# Patient Record
Sex: Male | Born: 1995 | Race: White | Hispanic: No | Marital: Single | Smoking: Never smoker
Health system: Southern US, Community
[De-identification: ages and names within clinical notes are randomized; demographics above are authoritative.]

## PROBLEM LIST (undated history)

## (undated) HISTORY — PX: APPENDECTOMY: SHX54

---
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
## 2019-02-18 ENCOUNTER — Emergency Department (HOSPITAL_COMMUNITY): Payer: No Typology Code available for payment source

## 2019-02-18 ENCOUNTER — Encounter (HOSPITAL_COMMUNITY): Admission: EM | Disposition: A | Discharge status: Home / Self Care | Payer: Self-pay | Source: Home / Self Care

## 2019-02-18 ENCOUNTER — Inpatient Hospital Stay (HOSPITAL_COMMUNITY): Payer: No Typology Code available for payment source | Admitting: Certified Registered"

## 2019-02-18 ENCOUNTER — Inpatient Hospital Stay (HOSPITAL_COMMUNITY): Payer: No Typology Code available for payment source

## 2019-02-18 ENCOUNTER — Encounter (HOSPITAL_COMMUNITY): Payer: Self-pay | Admitting: *Deleted

## 2019-02-18 ENCOUNTER — Inpatient Hospital Stay (HOSPITAL_COMMUNITY)
Admission: EM | Admit: 2019-02-18 | Discharge: 2019-02-22 | DRG: 116 | Disposition: A | Discharge status: Home / Self Care | Payer: No Typology Code available for payment source | Attending: General Surgery | Admitting: General Surgery

## 2019-02-18 ENCOUNTER — Other Ambulatory Visit: Payer: Self-pay

## 2019-02-18 DIAGNOSIS — M25572 Pain in left ankle and joints of left foot: Secondary | ICD-10-CM | POA: Diagnosis present

## 2019-02-18 DIAGNOSIS — S0522XA Ocular laceration and rupture with prolapse or loss of intraocular tissue, left eye, initial encounter: Principal | ICD-10-CM | POA: Diagnosis present

## 2019-02-18 DIAGNOSIS — S8252XA Displaced fracture of medial malleolus of left tibia, initial encounter for closed fracture: Secondary | ICD-10-CM | POA: Diagnosis present

## 2019-02-18 DIAGNOSIS — M542 Cervicalgia: Secondary | ICD-10-CM

## 2019-02-18 DIAGNOSIS — S022XXA Fracture of nasal bones, initial encounter for closed fracture: Secondary | ICD-10-CM

## 2019-02-18 DIAGNOSIS — J939 Pneumothorax, unspecified: Secondary | ICD-10-CM

## 2019-02-18 DIAGNOSIS — S01111A Laceration without foreign body of right eyelid and periocular area, initial encounter: Secondary | ICD-10-CM | POA: Diagnosis present

## 2019-02-18 DIAGNOSIS — S270XXA Traumatic pneumothorax, initial encounter: Secondary | ICD-10-CM | POA: Diagnosis present

## 2019-02-18 DIAGNOSIS — S0532XA Ocular laceration without prolapse or loss of intraocular tissue, left eye, initial encounter: Secondary | ICD-10-CM

## 2019-02-18 DIAGNOSIS — S62305A Unspecified fracture of fourth metacarpal bone, left hand, initial encounter for closed fracture: Secondary | ICD-10-CM | POA: Diagnosis present

## 2019-02-18 DIAGNOSIS — Y92410 Unspecified street and highway as the place of occurrence of the external cause: Secondary | ICD-10-CM

## 2019-02-18 DIAGNOSIS — S27322A Contusion of lung, bilateral, initial encounter: Secondary | ICD-10-CM | POA: Diagnosis present

## 2019-02-18 DIAGNOSIS — M25571 Pain in right ankle and joints of right foot: Secondary | ICD-10-CM

## 2019-02-18 DIAGNOSIS — S31821A Laceration without foreign body of left buttock, initial encounter: Secondary | ICD-10-CM

## 2019-02-18 DIAGNOSIS — S52512A Displaced fracture of left radial styloid process, initial encounter for closed fracture: Secondary | ICD-10-CM | POA: Diagnosis present

## 2019-02-18 DIAGNOSIS — Z20828 Contact with and (suspected) exposure to other viral communicable diseases: Secondary | ICD-10-CM | POA: Diagnosis present

## 2019-02-18 DIAGNOSIS — R402412 Glasgow coma scale score 13-15, at arrival to emergency department: Secondary | ICD-10-CM | POA: Diagnosis present

## 2019-02-18 DIAGNOSIS — H2102 Hyphema, left eye: Secondary | ICD-10-CM

## 2019-02-18 DIAGNOSIS — S025XXA Fracture of tooth (traumatic), initial encounter for closed fracture: Secondary | ICD-10-CM | POA: Diagnosis present

## 2019-02-18 DIAGNOSIS — Z23 Encounter for immunization: Secondary | ICD-10-CM | POA: Diagnosis not present

## 2019-02-18 DIAGNOSIS — S2231XA Fracture of one rib, right side, initial encounter for closed fracture: Secondary | ICD-10-CM | POA: Diagnosis present

## 2019-02-18 HISTORY — PX: RUPTURED GLOBE EXPLORATION AND REPAIR: SHX2366

## 2019-02-18 LAB — COMPREHENSIVE METABOLIC PANEL
ALT: 52 U/L — ABNORMAL HIGH (ref 0–44)
AST: 67 U/L — ABNORMAL HIGH (ref 15–41)
Albumin: 4.2 g/dL (ref 3.5–5.0)
Alkaline Phosphatase: 48 U/L (ref 38–126)
Anion gap: 15 (ref 5–15)
BUN: 14 mg/dL (ref 6–20)
CO2: 19 mmol/L — ABNORMAL LOW (ref 22–32)
Calcium: 9.1 mg/dL (ref 8.9–10.3)
Chloride: 106 mmol/L (ref 98–111)
Creatinine, Ser: 1.11 mg/dL (ref 0.61–1.24)
GFR calc Af Amer: >60 mL/min (ref >60)
GFR calc non Af Amer: >60 mL/min (ref >60)
Glucose, Bld: 109 mg/dL — ABNORMAL HIGH (ref 70–99)
Potassium: 3.4 mmol/L — ABNORMAL LOW (ref 3.5–5.1)
Sodium: 140 mmol/L (ref 135–145)
Total Bilirubin: 0.5 mg/dL (ref 0.3–1.2)
Total Protein: 7.2 g/dL (ref 6.5–8.1)

## 2019-02-18 LAB — PROTIME-INR
INR: 1.1 (ref 0.8–1.2)
Prothrombin Time: 14.3 seconds (ref 11.4–15.2)

## 2019-02-18 LAB — SURGICAL PCR SCREEN
MRSA, PCR: NEGATIVE
Staphylococcus aureus: POSITIVE — AB

## 2019-02-18 LAB — ETHANOL: Alcohol, Ethyl (B): 65 mg/dL — ABNORMAL HIGH (ref <10)

## 2019-02-18 LAB — CBC
HCT: 45 % (ref 39.0–52.0)
Hemoglobin: 15.5 g/dL (ref 13.0–17.0)
MCH: 31.3 pg (ref 26.0–34.0)
MCHC: 34.4 g/dL (ref 30.0–36.0)
MCV: 90.7 fL (ref 80.0–100.0)
Platelets: 254 10*3/uL (ref 150–400)
RBC: 4.96 MIL/uL (ref 4.22–5.81)
RDW: 11.8 % (ref 11.5–15.5)
WBC: 20.5 10*3/uL — ABNORMAL HIGH (ref 4.0–10.5)
nRBC: 0 % (ref 0.0–0.2)

## 2019-02-18 LAB — CDS SEROLOGY

## 2019-02-18 LAB — SARS CORONAVIRUS 2 BY RT PCR (HOSPITAL ORDER, PERFORMED IN ~~LOC~~ HOSPITAL LAB): SARS Coronavirus 2: NEGATIVE

## 2019-02-18 LAB — HIV ANTIBODY (ROUTINE TESTING W REFLEX): HIV Screen 4th Generation wRfx: NONREACTIVE

## 2019-02-18 LAB — SAMPLE TO BLOOD BANK

## 2019-02-18 LAB — LACTIC ACID, PLASMA: Lactic Acid, Venous: 3.1 mmol/L (ref 0.5–1.9)

## 2019-02-18 SURGERY — REPAIR, RUPTURE, GLOBE
Anesthesia: General | Site: Eye | Laterality: Left

## 2019-02-18 MED ORDER — MEPERIDINE HCL 25 MG/ML IJ SOLN: 6.2500 mg | INTRAMUSCULAR | Status: DC | PRN

## 2019-02-18 MED ORDER — FLUORESCEIN SODIUM 1 MG OP STRP
ORAL_STRIP | OPHTHALMIC | Status: AC
  Filled 2019-02-18: qty 1

## 2019-02-18 MED ORDER — TRIAMCINOLONE ACETONIDE 40 MG/ML IJ SUSP
INTRAMUSCULAR | Status: DC | PRN
  Administered 2019-02-18: 40 mg

## 2019-02-18 MED ORDER — LORAZEPAM 1 MG PO TABS: 1.0000 mg | ORAL_TABLET | ORAL | Status: AC | PRN

## 2019-02-18 MED ORDER — LACTATED RINGERS IV SOLN: INTRAVENOUS | Status: DC

## 2019-02-18 MED ORDER — ROCURONIUM BROMIDE 10 MG/ML (PF) SYRINGE
PREFILLED_SYRINGE | INTRAVENOUS | Status: AC
  Filled 2019-02-18: qty 20

## 2019-02-18 MED ORDER — DEXAMETHASONE SODIUM PHOSPHATE 10 MG/ML IJ SOLN
INTRAMUSCULAR | Status: AC
  Filled 2019-02-18: qty 1

## 2019-02-18 MED ORDER — SODIUM CHLORIDE 0.9 % IV SOLN
INTRAVENOUS | Status: DC
  Administered 2019-02-18 – 2019-02-21 (×6): via INTRAVENOUS

## 2019-02-18 MED ORDER — METOPROLOL TARTRATE 5 MG/5ML IV SOLN: 5.0000 mg | Freq: Four times a day (QID) | INTRAVENOUS | Status: DC | PRN

## 2019-02-18 MED ORDER — ADULT MULTIVITAMIN W/MINERALS CH
1.0000 | ORAL_TABLET | Freq: Every day | ORAL | Status: DC
  Administered 2019-02-19 – 2019-02-22 (×4): 1 via ORAL
  Filled 2019-02-18 (×4): qty 1

## 2019-02-18 MED ORDER — HYDROMORPHONE HCL 1 MG/ML IJ SOLN: 0.2500 mg | INTRAMUSCULAR | Status: DC | PRN

## 2019-02-18 MED ORDER — MIDAZOLAM HCL 2 MG/2ML IJ SOLN
INTRAMUSCULAR | Status: AC
  Filled 2019-02-18: qty 2

## 2019-02-18 MED ORDER — TRIAMCINOLONE ACETONIDE 40 MG/ML IJ SUSP
INTRAMUSCULAR | Status: AC
  Filled 2019-02-18: qty 5

## 2019-02-18 MED ORDER — BACITRACIN-POLYMYXIN B 500-10000 UNIT/GM OP OINT
TOPICAL_OINTMENT | OPHTHALMIC | Status: AC
  Filled 2019-02-18: qty 3.5

## 2019-02-18 MED ORDER — PANTOPRAZOLE SODIUM 40 MG PO TBEC
40.0000 mg | DELAYED_RELEASE_TABLET | Freq: Every day | ORAL | Status: DC
  Administered 2019-02-19 – 2019-02-22 (×4): 40 mg via ORAL
  Filled 2019-02-18 (×4): qty 1

## 2019-02-18 MED ORDER — PROPOFOL 10 MG/ML IV BOLUS
INTRAVENOUS | Status: DC | PRN
  Administered 2019-02-18: 50 mg via INTRAVENOUS
  Administered 2019-02-18: 160 mg via INTRAVENOUS

## 2019-02-18 MED ORDER — BUPIVACAINE HCL (PF) 0.75 % IJ SOLN
INTRAMUSCULAR | Status: AC
  Filled 2019-02-18: qty 10

## 2019-02-18 MED ORDER — LIDOCAINE-EPINEPHRINE (PF) 2 %-1:200000 IJ SOLN
20.0000 mL | Freq: Once | INTRAMUSCULAR | Status: AC
  Administered 2019-02-18: 20 mL
  Filled 2019-02-18: qty 20

## 2019-02-18 MED ORDER — SUGAMMADEX SODIUM 200 MG/2ML IV SOLN
INTRAVENOUS | Status: DC | PRN
  Administered 2019-02-18: 150 mg via INTRAVENOUS

## 2019-02-18 MED ORDER — FENTANYL CITRATE (PF) 250 MCG/5ML IJ SOLN
INTRAMUSCULAR | Status: AC
  Filled 2019-02-18: qty 5

## 2019-02-18 MED ORDER — SODIUM HYALURONATE 10 MG/ML IO SOLN
INTRAOCULAR | Status: AC
  Filled 2019-02-18: qty 1.7

## 2019-02-18 MED ORDER — ACETAMINOPHEN 325 MG PO TABS: 650.0000 mg | ORAL_TABLET | ORAL | Status: DC | PRN

## 2019-02-18 MED ORDER — ONDANSETRON HCL 4 MG/2ML IJ SOLN
INTRAMUSCULAR | Status: AC
  Filled 2019-02-18: qty 2

## 2019-02-18 MED ORDER — STERILE WATER FOR INJECTION IJ SOLN
INTRAMUSCULAR | Status: DC | PRN
  Administered 2019-02-18: 19:00:00 20 mL

## 2019-02-18 MED ORDER — ATROPINE SULFATE 1 % OP SOLN
OPHTHALMIC | Status: DC | PRN
  Administered 2019-02-18: 2 [drp] via OPHTHALMIC

## 2019-02-18 MED ORDER — PROPOFOL 10 MG/ML IV BOLUS
INTRAVENOUS | Status: AC
  Filled 2019-02-18: qty 20

## 2019-02-18 MED ORDER — MORPHINE SULFATE (PF) 2 MG/ML IV SOLN
1.0000 mg | INTRAVENOUS | Status: DC | PRN
  Administered 2019-02-18 – 2019-02-21 (×6): 2 mg via INTRAVENOUS
  Filled 2019-02-18 (×6): qty 1

## 2019-02-18 MED ORDER — FENTANYL CITRATE (PF) 100 MCG/2ML IJ SOLN
50.0000 ug | Freq: Once | INTRAMUSCULAR | Status: AC
  Administered 2019-02-18: 06:00:00 50 ug via INTRAVENOUS
  Filled 2019-02-18: qty 2

## 2019-02-18 MED ORDER — VITAMIN B-1 100 MG PO TABS
100.0000 mg | ORAL_TABLET | Freq: Every day | ORAL | Status: DC
  Administered 2019-02-19 – 2019-02-22 (×4): 100 mg via ORAL
  Filled 2019-02-18 (×4): qty 1

## 2019-02-18 MED ORDER — DEXMEDETOMIDINE HCL 200 MCG/2ML IV SOLN
INTRAVENOUS | Status: DC | PRN
  Administered 2019-02-18: 20 ug via INTRAVENOUS

## 2019-02-18 MED ORDER — FOLIC ACID 1 MG PO TABS
1.0000 mg | ORAL_TABLET | Freq: Every day | ORAL | Status: DC
  Administered 2019-02-19 – 2019-02-22 (×4): 1 mg via ORAL
  Filled 2019-02-18 (×4): qty 1

## 2019-02-18 MED ORDER — CEFAZOLIN SODIUM-DEXTROSE 2-4 GM/100ML-% IV SOLN
2.0000 g | Freq: Once | INTRAVENOUS | Status: AC
  Administered 2019-02-18: 06:00:00 2 g via INTRAVENOUS
  Filled 2019-02-18: qty 100

## 2019-02-18 MED ORDER — LORAZEPAM 2 MG/ML IJ SOLN: 1.0000 mg | INTRAMUSCULAR | Status: AC | PRN

## 2019-02-18 MED ORDER — CEFTAZIDIME 1 G IJ SOLR
INTRAMUSCULAR | Status: AC
  Filled 2019-02-18: qty 1

## 2019-02-18 MED ORDER — PANTOPRAZOLE SODIUM 40 MG IV SOLR
40.0000 mg | Freq: Every day | INTRAVENOUS | Status: DC
  Administered 2019-02-18: 40 mg via INTRAVENOUS
  Filled 2019-02-18 (×3): qty 40

## 2019-02-18 MED ORDER — BSS IO SOLN
INTRAOCULAR | Status: DC | PRN
  Administered 2019-02-18: 15 mL

## 2019-02-18 MED ORDER — NA CHONDROIT SULF-NA HYALURON 40-30 MG/ML IO SOLN
INTRAOCULAR | Status: AC
  Filled 2019-02-18: qty 0.5

## 2019-02-18 MED ORDER — NA CHONDROIT SULF-NA HYALURON 40-30 MG/ML IO SOLN
INTRAOCULAR | Status: DC | PRN
  Administered 2019-02-18: 0.5 mL via INTRAOCULAR

## 2019-02-18 MED ORDER — DEXAMETHASONE SODIUM PHOSPHATE 10 MG/ML IJ SOLN
INTRAMUSCULAR | Status: DC | PRN
  Administered 2019-02-18: 4 mg via INTRAVENOUS

## 2019-02-18 MED ORDER — LIDOCAINE HCL 2 % IJ SOLN
INTRAMUSCULAR | Status: AC
  Filled 2019-02-18: qty 20

## 2019-02-18 MED ORDER — FLUORESCEIN SODIUM 1 MG OP STRP
ORAL_STRIP | OPHTHALMIC | Status: DC | PRN
  Administered 2019-02-18: 1 via OPHTHALMIC

## 2019-02-18 MED ORDER — IOHEXOL 300 MG/ML  SOLN
100.0000 mL | Freq: Once | INTRAMUSCULAR | Status: AC | PRN
  Administered 2019-02-18: 100 mL via INTRAVENOUS

## 2019-02-18 MED ORDER — ONDANSETRON HCL 4 MG/2ML IJ SOLN: 4.0000 mg | Freq: Four times a day (QID) | INTRAMUSCULAR | Status: DC | PRN

## 2019-02-18 MED ORDER — SODIUM HYALURONATE 10 MG/ML IO SOLN
INTRAOCULAR | Status: AC
  Filled 2019-02-18: qty 0.85

## 2019-02-18 MED ORDER — LIDOCAINE HCL 2 % IJ SOLN
INTRAMUSCULAR | Status: DC | PRN
  Administered 2019-02-18: 10 mL

## 2019-02-18 MED ORDER — TETRACAINE 0.5 % OP SOLN OPTIME - NO CHARGE
OPHTHALMIC | Status: DC | PRN
  Administered 2019-02-18: 19:00:00 3 [drp] via OPHTHALMIC

## 2019-02-18 MED ORDER — ACETAMINOPHEN 325 MG PO TABS: 325.0000 mg | ORAL_TABLET | Freq: Once | ORAL | Status: DC | PRN

## 2019-02-18 MED ORDER — THIAMINE HCL 100 MG/ML IJ SOLN: 100.0000 mg | Freq: Every day | INTRAMUSCULAR | Status: DC

## 2019-02-18 MED ORDER — LIDOCAINE 2% (20 MG/ML) 5 ML SYRINGE
INTRAMUSCULAR | Status: DC | PRN
  Administered 2019-02-18: 60 mg via INTRAVENOUS

## 2019-02-18 MED ORDER — ACETAMINOPHEN 160 MG/5ML PO SOLN: 325.0000 mg | Freq: Once | ORAL | Status: DC | PRN

## 2019-02-18 MED ORDER — OXYCODONE HCL 5 MG PO TABS
5.0000 mg | ORAL_TABLET | ORAL | Status: DC | PRN
  Administered 2019-02-19 – 2019-02-22 (×9): 10 mg via ORAL
  Filled 2019-02-18 (×10): qty 2

## 2019-02-18 MED ORDER — TETRACAINE HCL 0.5 % OP SOLN
2.0000 [drp] | Freq: Once | OPHTHALMIC | Status: AC
  Administered 2019-02-18: 2 [drp] via OPHTHALMIC
  Filled 2019-02-18: qty 4

## 2019-02-18 MED ORDER — ACETAMINOPHEN 10 MG/ML IV SOLN: 1000.0000 mg | Freq: Once | INTRAVENOUS | Status: DC | PRN

## 2019-02-18 MED ORDER — ATROPINE SULFATE 1 % OP SOLN
OPHTHALMIC | Status: AC
  Filled 2019-02-18: qty 5

## 2019-02-18 MED ORDER — ONDANSETRON 4 MG PO TBDP: 4.0000 mg | ORAL_TABLET | Freq: Four times a day (QID) | ORAL | Status: DC | PRN

## 2019-02-18 MED ORDER — ROCURONIUM BROMIDE 10 MG/ML (PF) SYRINGE
PREFILLED_SYRINGE | INTRAVENOUS | Status: DC | PRN
  Administered 2019-02-18: 50 mg via INTRAVENOUS

## 2019-02-18 MED ORDER — POLYMYXIN B SULFATE 500000 UNITS IJ SOLR
INTRAMUSCULAR | Status: AC
  Filled 2019-02-18: qty 500000

## 2019-02-18 MED ORDER — BACITRACIN-POLYMYXIN B 500-10000 UNIT/GM OP OINT
TOPICAL_OINTMENT | OPHTHALMIC | Status: DC | PRN
  Administered 2019-02-18: 1 via OPHTHALMIC

## 2019-02-18 MED ORDER — BUPIVACAINE HCL (PF) 0.75 % IJ SOLN
INTRAMUSCULAR | Status: DC | PRN
  Administered 2019-02-18: 10 mL

## 2019-02-18 MED ORDER — STERILE WATER FOR INJECTION IJ SOLN
INTRAMUSCULAR | Status: AC
  Filled 2019-02-18: qty 10

## 2019-02-18 MED ORDER — TETANUS-DIPHTH-ACELL PERTUSSIS 5-2.5-18.5 LF-MCG/0.5 IM SUSP
0.5000 mL | Freq: Once | INTRAMUSCULAR | Status: AC
  Administered 2019-02-18: 0.5 mL via INTRAMUSCULAR
  Filled 2019-02-18: qty 0.5

## 2019-02-18 MED ORDER — PROMETHAZINE HCL 25 MG/ML IJ SOLN: 6.2500 mg | INTRAMUSCULAR | Status: DC | PRN

## 2019-02-18 MED ORDER — PHENYLEPHRINE 40 MCG/ML (10ML) SYRINGE FOR IV PUSH (FOR BLOOD PRESSURE SUPPORT)
PREFILLED_SYRINGE | INTRAVENOUS | Status: DC | PRN
  Administered 2019-02-18 (×2): 80 ug via INTRAVENOUS

## 2019-02-18 MED ORDER — SUCCINYLCHOLINE CHLORIDE 20 MG/ML IJ SOLN
INTRAMUSCULAR | Status: DC | PRN
  Administered 2019-02-18: 160 mg via INTRAVENOUS

## 2019-02-18 MED ORDER — SODIUM CHLORIDE (PF) 0.9 % IJ SOLN
INTRAMUSCULAR | Status: AC
  Filled 2019-02-18: qty 10

## 2019-02-18 MED ORDER — FLUORESCEIN SODIUM 1 MG OP STRP
1.0000 | ORAL_STRIP | Freq: Once | OPHTHALMIC | Status: AC
  Administered 2019-02-18: 1 via OPHTHALMIC
  Filled 2019-02-18: qty 1

## 2019-02-18 MED ORDER — SODIUM HYALURONATE 10 MG/ML IO SOLN
INTRAOCULAR | Status: DC | PRN
  Administered 2019-02-18 (×2): 0.85 mL via INTRAOCULAR

## 2019-02-18 MED ORDER — BSS IO SOLN
INTRAOCULAR | Status: AC
  Filled 2019-02-18: qty 15

## 2019-02-18 MED ORDER — LIDOCAINE 2% (20 MG/ML) 5 ML SYRINGE
INTRAMUSCULAR | Status: AC
  Filled 2019-02-18: qty 5

## 2019-02-18 MED ORDER — DOCUSATE SODIUM 100 MG PO CAPS
100.0000 mg | ORAL_CAPSULE | Freq: Two times a day (BID) | ORAL | Status: DC
  Administered 2019-02-18 – 2019-02-22 (×8): 100 mg via ORAL
  Filled 2019-02-18 (×8): qty 1

## 2019-02-18 MED ORDER — ESMOLOL HCL 100 MG/10ML IV SOLN
INTRAVENOUS | Status: AC
  Filled 2019-02-18: qty 10

## 2019-02-18 MED ORDER — ONDANSETRON HCL 4 MG/2ML IJ SOLN
4.0000 mg | Freq: Once | INTRAMUSCULAR | Status: AC
  Administered 2019-02-18: 4 mg via INTRAVENOUS
  Filled 2019-02-18: qty 2

## 2019-02-18 MED ORDER — TETRACAINE HCL 0.5 % OP SOLN
OPHTHALMIC | Status: AC
  Filled 2019-02-18: qty 4

## 2019-02-18 MED ORDER — SODIUM CHLORIDE 0.9 % IV SOLN
INTRAVENOUS | Status: DC
  Administered 2019-02-18: 17:00:00 via INTRAVENOUS

## 2019-02-18 MED ORDER — PHENYLEPHRINE 40 MCG/ML (10ML) SYRINGE FOR IV PUSH (FOR BLOOD PRESSURE SUPPORT)
PREFILLED_SYRINGE | INTRAVENOUS | Status: AC
  Filled 2019-02-18: qty 30

## 2019-02-18 MED ORDER — STERILE WATER FOR IRRIGATION IR SOLN
Status: DC | PRN
  Administered 2019-02-18: 1000 mL

## 2019-02-18 MED ORDER — ROCURONIUM BROMIDE 10 MG/ML (PF) SYRINGE
PREFILLED_SYRINGE | INTRAVENOUS | Status: AC
  Filled 2019-02-18: qty 10

## 2019-02-18 MED ORDER — FENTANYL CITRATE (PF) 250 MCG/5ML IJ SOLN
INTRAMUSCULAR | Status: DC | PRN
  Administered 2019-02-18: 150 ug via INTRAVENOUS
  Administered 2019-02-18 (×2): 50 ug via INTRAVENOUS

## 2019-02-18 MED ORDER — ONDANSETRON HCL 4 MG/2ML IJ SOLN
INTRAMUSCULAR | Status: DC | PRN
  Administered 2019-02-18: 4 mg via INTRAVENOUS

## 2019-02-18 SURGICAL SUPPLY — 27 items
CORD BIPOLAR FORCEPS 12FT (ELECTRODE) ×2 IMPLANT
COVER SURGICAL LIGHT HANDLE (MISCELLANEOUS) ×3 IMPLANT
COVER WAND RF STERILE (DRAPES) ×3 IMPLANT
DRAPE OPHTHALMIC 77X100 STRL (CUSTOM PROCEDURE TRAY) ×3 IMPLANT
GLOVE BIO SURGEON STRL SZ7.5 (GLOVE) ×4 IMPLANT
GOWN STRL REUS W/ TWL LRG LVL3 (GOWN DISPOSABLE) ×2 IMPLANT
GOWN STRL REUS W/ TWL XL LVL3 (GOWN DISPOSABLE) IMPLANT
GOWN STRL REUS W/TWL LRG LVL3 (GOWN DISPOSABLE) ×6
GOWN STRL REUS W/TWL XL LVL3 (GOWN DISPOSABLE) ×3
KIT BASIN OR (CUSTOM PROCEDURE TRAY) ×3 IMPLANT
KIT TURNOVER KIT B (KITS) ×3 IMPLANT
NDL 18GX1X1/2 (RX/OR ONLY) (NEEDLE) ×1 IMPLANT
NDL PRECISIONGLIDE 27X1.5 (NEEDLE) IMPLANT
NEEDLE 18GX1X1/2 (RX/OR ONLY) (NEEDLE) ×6 IMPLANT
NEEDLE PRECISIONGLIDE 27X1.5 (NEEDLE) IMPLANT
PACK VITRECTOMY CUSTOM (CUSTOM PROCEDURE TRAY) ×3 IMPLANT
PAD ARMBOARD 7.5X6 YLW CONV (MISCELLANEOUS) ×4 IMPLANT
SPEAR EYE SURG WECK-CEL (MISCELLANEOUS) ×2 IMPLANT
SUT ETHILON 10 0 CS140 6 (SUTURE) ×4 IMPLANT
SWAB CULTURE ESWAB REG 1ML (MISCELLANEOUS) IMPLANT
SYR 10ML LL (SYRINGE) IMPLANT
SYR 20ML LL LF (SYRINGE) ×3 IMPLANT
TAPE SURG TRANSPORE 1 IN (GAUZE/BANDAGES/DRESSINGS) IMPLANT
TAPE SURGICAL TRANSPORE 1 IN (GAUZE/BANDAGES/DRESSINGS) ×2
TOWEL GREEN STERILE FF (TOWEL DISPOSABLE) ×9 IMPLANT
TUBING HIGH PRESS EXTEN 6IN (TUBING) IMPLANT
WATER STERILE IRR 1000ML POUR (IV SOLUTION) ×3 IMPLANT
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
## 2019-02-18 NOTE — ED Notes (Signed)
Patient transported to X-ray 

## 2019-02-18 NOTE — Progress Notes (Signed)
Orthopedic Tech Progress Note Patient Details:  Robert Vaughan [birth year] [id number]  Ortho Devices Type of Ortho Device: CAM walker Ortho Device/Splint Location: LLE Ortho Device/Splint Interventions: Adjustment, Application, Ordered   Post Interventions Patient Tolerated: Well Instructions Provided: Other (comment)   [name] 02/18/2019, 3:08 PM

## 2019-02-18 NOTE — Progress Notes (Signed)
Received pt from PACU. Pt alert and oriented x4. Pt denies pain. Patch to right eye. Pt asking for water to drink. Diet advanced per order. Will monitor pt.

## 2019-02-18 NOTE — Transfer of Care (Signed)
Immediate Anesthesia Transfer of Care Note  Patient: Robert Vaughan  Procedure(s) Performed: REPAIR OF RUPTURED GLOBE LEFT EYE (Left Eye)  Patient Location: PACU  Anesthesia Type:General  Level of Consciousness: drowsy and patient cooperative  Airway & Oxygen Therapy: Patient Spontanous Breathing and Patient connected to nasal cannula oxygen  Post-op Assessment: Report given to RN and Post -op Vital signs reviewed and stable  Post vital signs: Reviewed and stable  Last Vitals:  Vitals Value Taken Time  BP 120/103 02/18/19 1947  Temp    Pulse 88 02/18/19 1949  Resp 16 02/18/19 1949  SpO2 97 % 02/18/19 1949  Vitals shown include unvalidated device data.  Last Pain:  Vitals:   02/18/19 1359  TempSrc:   PainSc: Asleep         Complications: No apparent anesthesia complications

## 2019-02-18 NOTE — Anesthesia Preprocedure Evaluation (Addendum)
Anesthesia Evaluation  Patient identified by MRN, date of birth, ID band Patient awake    Reviewed: Allergy & Precautions, NPO status , Patient's Chart, lab work & pertinent test results  Airway Mallampati: IV   Neck ROM: Limited  Mouth opening: Limited Mouth Opening  Dental  (+) Teeth Intact, Dental Advisory Given, Poor Dentition   Pulmonary neg pulmonary ROS,     + decreased breath sounds      Cardiovascular negative cardio ROS   Rhythm:Regular Rate:Normal     Neuro/Psych negative neurological ROS  negative psych ROS   GI/Hepatic negative GI ROS, Neg liver ROS,   Endo/Other    Renal/GU      Musculoskeletal   Abdominal Normal abdominal exam  (+)   Peds  Hematology negative hematology ROS (+)   Anesthesia Other Findings   Reproductive/Obstetrics                            Anesthesia Physical Anesthesia Plan  ASA: I and emergent  Anesthesia Plan: General   Post-op Pain Management:    Induction: Intravenous, Rapid sequence and Cricoid pressure planned  PONV Risk Score and Plan: 3 and Ondansetron, Dexamethasone and Midazolam  Airway Management Planned: Oral ETT and Video Laryngoscope Planned  Additional Equipment: None  Intra-op Plan:   Post-operative Plan: Extubation in OR  Informed Consent: I have reviewed the patients History and Physical, chart, labs and discussed the procedure including the risks, benefits and alternatives for the proposed anesthesia with the patient or authorized representative who has indicated his/her understanding and acceptance.     Dental advisory given  Plan Discussed with: CRNA  Anesthesia Plan Comments:        Anesthesia Quick Evaluation
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
## 2019-02-18 NOTE — Progress Notes (Signed)
Orthopedic Tech Progress Note Patient Details:  Cray Monnin 1996-01-07 [id number] Applied long arm splint to patient. Even tho the order was for the RIGHT arm its really the LEFT.  Ortho Devices Type of Ortho Device: Arm sling, Long arm splint Ortho Device/Splint Location: ULE Ortho Device/Splint Interventions: Adjustment, Application, Ordered   Post Interventions Patient Tolerated: Well Instructions Provided: Care of device, Adjustment of device   [name] 02/18/2019, 8:23 AM

## 2019-02-18 NOTE — Op Note (Signed)
OPHTHALMOLOGY OPERATIVE NOTE  SURGEON: Julian Reil, M.D.   OPERATIVE EYE: Left   PRE OP DIAGNOSIS: Ruptured globe, Left eye  POST OP DIAGNOSIS: Same  PROCEDURE: Repair of ruptured globe, left eye  ANESTHESIA: General anesthesia  COMPLICATIONS:  NONE  DESCRIPTION OF PROCEDURE:  The patient was seen in the preoperative area where the surgical eye was marked and review of risk benefits and alternatives took place. Discussed poor visual prognosis given poor initial vision. Discussed need for additional surgeries. The patient was brought back to the operating room. The eye was prepped and draped in the usual sterile fashion for surgery.  A retrobulbar block of lidocane and bupivicane was given. The Surgeon sat superiorly. A lid speculum was placed. . The eye was inspected and a 3mm corneal laceration in a "Y" configuration was noted. Iris prolapsed tissue was excised and the wound was well demarcated.An inferior was made with a 15 degree supersharp blade and provisc was instilled into the eye. Iris was retracted into the eye.  A 10-0 nylon sutures were used to approximate the wound edges of the cornea and sutured closed.  When the wound was found to be tight, the eye was pressurized.  The poor to the anterior chamber was very poor.  I was suspicious for a partially dislocated lens. However, I felt it was unsafe to explore the intra ocular contents.  A subtenon injection of 40mg  kenalog was given An eye patch was placed with steroid antibiotic ointment and atropine drops were instilled. The patient was taken to recovery in stable condition.

## 2019-02-18 NOTE — Anesthesia Procedure Notes (Addendum)
Procedure Name: Intubation Performed by: Milford Cage, CRNA Pre-anesthesia Checklist: Patient identified, Emergency Drugs available, Suction available and Patient being monitored Patient Re-evaluated:Patient Re-evaluated prior to induction Oxygen Delivery Method: Circle System Utilized Preoxygenation: Pre-oxygenation with 100% oxygen Induction Type: IV induction and Rapid sequence Laryngoscope Size: Glidescope and 4 Grade View: Grade I Tube type: Oral Tube size: 7.5 mm Number of attempts: 1 Airway Equipment and Method: Stylet and Oral airway Placement Confirmation: ETT inserted through vocal cords under direct vision,  positive ETCO2 and breath sounds checked- equal and bilateral Secured at: 23 cm Tube secured with: Tape Dental Injury: Teeth and Oropharynx as per pre-operative assessment  Difficulty Due To: Difficulty was anticipated and Difficult Airway- due to cervical collar
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
## 2019-02-18 NOTE — ED Notes (Signed)
[place] pts mother
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
## 2019-02-19 ENCOUNTER — Inpatient Hospital Stay (HOSPITAL_COMMUNITY): Payer: No Typology Code available for payment source

## 2019-02-19 ENCOUNTER — Encounter (HOSPITAL_COMMUNITY): Payer: Self-pay | Admitting: Ophthalmology

## 2019-02-19 LAB — URINALYSIS, ROUTINE W REFLEX MICROSCOPIC
Bilirubin Urine: NEGATIVE
Glucose, UA: NEGATIVE mg/dL
Hgb urine dipstick: NEGATIVE
Ketones, ur: NEGATIVE mg/dL
Leukocytes,Ua: NEGATIVE
Nitrite: NEGATIVE
Protein, ur: NEGATIVE mg/dL
Specific Gravity, Urine: 1.006 (ref 1.005–1.030)
pH: 7 (ref 5.0–8.0)

## 2019-02-19 LAB — BASIC METABOLIC PANEL
Anion gap: 13 (ref 5–15)
BUN: 10 mg/dL (ref 6–20)
CO2: 19 mmol/L — ABNORMAL LOW (ref 22–32)
Calcium: 8.4 mg/dL — ABNORMAL LOW (ref 8.9–10.3)
Chloride: 106 mmol/L (ref 98–111)
Creatinine, Ser: 0.91 mg/dL (ref 0.61–1.24)
GFR calc Af Amer: >60 mL/min (ref >60)
GFR calc non Af Amer: >60 mL/min (ref >60)
Glucose, Bld: 122 mg/dL — ABNORMAL HIGH (ref 70–99)
Potassium: 4.2 mmol/L (ref 3.5–5.1)
Sodium: 138 mmol/L (ref 135–145)

## 2019-02-19 LAB — CBC
HCT: 40.4 % (ref 39.0–52.0)
Hemoglobin: 14 g/dL (ref 13.0–17.0)
MCH: 31.7 pg (ref 26.0–34.0)
MCHC: 34.7 g/dL (ref 30.0–36.0)
MCV: 91.4 fL (ref 80.0–100.0)
Platelets: 183 10*3/uL (ref 150–400)
RBC: 4.42 MIL/uL (ref 4.22–5.81)
RDW: 11.9 % (ref 11.5–15.5)
WBC: 11.7 10*3/uL — ABNORMAL HIGH (ref 4.0–10.5)
nRBC: 0 % (ref 0.0–0.2)

## 2019-02-19 LAB — PHOSPHORUS: Phosphorus: 3.2 mg/dL (ref 2.5–4.6)

## 2019-02-19 LAB — MAGNESIUM: Magnesium: 2 mg/dL (ref 1.7–2.4)

## 2019-02-19 MED ORDER — ATROPINE SULFATE 1 % OP SOLN
1.0000 [drp] | Freq: Every day | OPHTHALMIC | Status: DC
  Administered 2019-02-19 – 2019-02-22 (×4): 1 [drp] via OPHTHALMIC
  Filled 2019-02-19: qty 5

## 2019-02-19 MED ORDER — PREDNISOLONE ACETATE 1 % OP SUSP
1.0000 [drp] | Freq: Four times a day (QID) | OPHTHALMIC | Status: DC
  Administered 2019-02-19 – 2019-02-22 (×13): 1 [drp] via OPHTHALMIC
  Filled 2019-02-19 (×2): qty 5

## 2019-02-19 MED ORDER — HYPROMELLOSE (GONIOSCOPIC) 2.5 % OP SOLN
1.0000 [drp] | OPHTHALMIC | Status: DC | PRN
  Administered 2019-02-20: 20:00:00 1 [drp] via OPHTHALMIC
  Filled 2019-02-19: qty 15

## 2019-02-19 MED ORDER — OFLOXACIN 0.3 % OP SOLN
1.0000 [drp] | Freq: Four times a day (QID) | OPHTHALMIC | Status: DC
  Administered 2019-02-19 – 2019-02-22 (×12): 1 [drp] via OPHTHALMIC
  Filled 2019-02-19: qty 5
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
## 2019-02-19 NOTE — Anesthesia Postprocedure Evaluation (Signed)
Anesthesia Post Note  Patient: Robert Vaughan  Procedure(s) Performed: REPAIR OF RUPTURED GLOBE LEFT EYE (Left Eye)     Patient location during evaluation: PACU Anesthesia Type: General Level of consciousness: awake and alert Pain management: pain level controlled Vital Signs Assessment: post-procedure vital signs reviewed and stable Respiratory status: spontaneous breathing, nonlabored ventilation and respiratory function stable Cardiovascular status: blood pressure returned to baseline and stable Postop Assessment: no apparent nausea or vomiting Anesthetic complications: no    Last Vitals:  Vitals:   02/18/19 2035 02/18/19 2100  BP: 114/65 126/73  Pulse: 86 82  Resp: 17 20  Temp: 37.1 C 37.3 C  SpO2: 100% 100%    Last Pain:  Vitals:   02/18/19 2105  TempSrc:   PainSc: [place]

## 2019-02-19 NOTE — Progress Notes (Addendum)
S:   POD1 repair of ruptured globe left eye. Eye Pain 3/10 overnight. Some scratching.  Mild ache.  "I had a long night"   O:  V: LP OS Motilty: Full ductions and versions CF: Responds to light all four quadrants Pupils: ?APD OS by reverse.  IOP (tonopen): 9 OS Slit Lamp:  Lids: Mild edema Conj/Sclera: 1+ Injection Cornea: Sutures intact. No obvious leak. Sidel test appears negative (difficult at bedside).  AC: Cell, Trace hyphema. Deep.  Pupil: Irregular, post surgical Lens: Unable to appreciate: Heme and fibrin obscuring.  Assessment and Plan POD1: repair of ruptured globe left eye. Progressing appropriately.  Start Prednisolone QID OS Ofloxacin QID OS  Atropine QD OS Artifical tears Q1hr as needed for irritation.    Discussed poor prognosis. Discussed likelihood of needing more than one surgery to restore vision.   Call with new or worsening symptom.  From an eye stand point, the patient if fine to be discharged. Follow up with ophthalmology as outpatient within 3 days of discharge. Continue drops as described. Will continue to follow while inpatient.    Julian Reil, M.D.  [facility] [address] [place], [place] [address] [phone] ([phone]

## 2019-02-20 ENCOUNTER — Inpatient Hospital Stay (HOSPITAL_COMMUNITY): Payer: No Typology Code available for payment source
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
## 2019-02-20 NOTE — Progress Notes (Signed)
Rehab Admissions Coordinator Note:  Per PT recommendation, this patient was screened by Raechel Ache for appropriateness for an Inpatient Acute Rehab Consult.  At this time, we are recommending Inpatient Rehab consult. AC will contact MD to request order.   Raechel Ache 02/20/2019, 3:47 PM  I can be reached at [phone].

## 2019-02-21 MED ORDER — TETRACAINE HCL 0.5 % OP SOLN
2.0000 [drp] | Freq: Once | OPHTHALMIC | Status: AC
  Administered 2019-02-21: 22:00:00 2 [drp] via OPHTHALMIC
  Filled 2019-02-21: qty 4

## 2019-02-21 MED ORDER — ACETAMINOPHEN 325 MG PO TABS
650.0000 mg | ORAL_TABLET | Freq: Four times a day (QID) | ORAL | Status: DC
  Administered 2019-02-21 – 2019-02-22 (×5): 650 mg via ORAL
  Filled 2019-02-21 (×5): qty 2

## 2019-02-21 MED ORDER — MORPHINE SULFATE (PF) 4 MG/ML IV SOLN
4.0000 mg | Freq: Once | INTRAVENOUS | Status: AC
  Administered 2019-02-21: 4 mg via INTRAVENOUS
  Filled 2019-02-21: qty 1

## 2019-02-21 MED ORDER — CHLORHEXIDINE GLUCONATE CLOTH 2 % EX PADS
6.0000 | MEDICATED_PAD | Freq: Every day | CUTANEOUS | Status: DC
  Administered 2019-02-22: 10:00:00 6 via TOPICAL

## 2019-02-21 MED ORDER — MORPHINE SULFATE (PF) 2 MG/ML IV SOLN
1.0000 mg | INTRAVENOUS | Status: DC | PRN
  Administered 2019-02-21 – 2019-02-22 (×3): 2 mg via INTRAVENOUS
  Filled 2019-02-21 (×3): qty 1

## 2019-02-21 MED ORDER — METHOCARBAMOL 500 MG PO TABS: 500.0000 mg | ORAL_TABLET | Freq: Four times a day (QID) | ORAL | Status: DC | PRN

## 2019-02-21 MED ORDER — MUPIROCIN 2 % EX OINT
1.0000 "application " | TOPICAL_OINTMENT | Freq: Two times a day (BID) | CUTANEOUS | Status: DC
  Administered 2019-02-21 – 2019-02-22 (×2): 1 via NASAL
  Filled 2019-02-21: qty 22

## 2019-02-21 NOTE — Progress Notes (Signed)
Inpatient Rehab Admissions:  Inpatient Rehab Consult received.  Note therapy notes with updated recommendations for home health follow up.  Pt and family comfortable with dispo.  Will sign off at this time.   Shann Medal, PT, DPT Admissions Coordinator [phone] 02/21/19  2:51 PM
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
## 2019-02-21 NOTE — Progress Notes (Signed)
Orthopedic Tech Progress Note Patient Details:  Robert Vaughan 12/22/1995 [id number]  Ortho Devices Type of Ortho Device: ASO Ortho Device/Splint Location: LRE Ortho Device/Splint Interventions: Adjustment, Application, Ordered   Post Interventions Patient Tolerated: Well Instructions Provided: Care of device, Adjustment of device   [name] 02/21/2019, 11:50 AM
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
## 2019-02-21 NOTE — Discharge Instructions (Signed)
Ruptured Globe A ruptured globe is when the wall of the eye splits open due to an injury. This can happen when a sharp object punctures the eyeball or when the eyeball is struck so hard that it breaks open. A ruptured globe is a medical emergency. Do not wait to see if the symptoms will go away. Get medical help right away. What are the causes? This condition may be caused by any accident in which a sharp or a blunt object hits the eye or the eye strikes a hard object. It may happen after:  Being forcefully poked in the eye by a sharp object, such as a stick, pencil, or scissors.  Being hit directly on the eye by a blunt object, such as a baseball bat, ball, or fist.  Being struck in the eye by a flying object. Injuries from [place] objects may occur at work, during home improvement projects, or during sporting events.  Striking the face on a hard object, such as a car dashboard during a crash.  Falling and hitting the eye. What are the signs or symptoms? Symptoms of this condition include:  Pain.  Vision loss.  Decreased ability to move the eye normally.  Blood or fluid leaking from the eye. How is this diagnosed? This condition is diagnosed with a medical history and physical exam. You may also have imaging tests, including:  CT scan.  X-ray.  MRI.  Ultrasound. How is this treated? Treatment for this condition typically requires surgery by an eye surgeon (ophthalmologist). Before surgery is done, initial treatment may include:  Keeping the eye covered with a shield to protect the eye from additional or further injury.  Medicines to prevent vomiting. Vomiting can increase pressure in the eye and cause further injury.  Pain medicines and medicine that relaxes you (sedative).  Antibiotic medicines to prevent or treat infection.  Bed rest.  A tetanus shot. Follow these instructions at home: Medicines  Take over-the-counter and prescription medicines only as told by  your health care provider.  If you were prescribed an antibiotic medicine, take it as told by your health care provider. Do not stop using the antibiotic even if you start to feel better. Driving  Ask your health care provider when it is safe to drive.  Ask your health care provider if the medicine prescribed to you requires you to avoid driving or using heavy machinery.  Do not drive for 24 hours if you were given a sedative. General instructions  Return to your normal activities as told by your health care provider. Ask your health care provider what activities are safe for you.  Wear eye protection when engaging in activities that might result in an eye injury.  Keep all follow-up visits as told by your health care provider. This is important. Summary  A ruptured globe is when the wall of the eye splits open due to an injury.  A ruptured globe is a medical emergency.  This condition may be caused by any accident in which a sharp or a blunt object hits the eye or the eye strikes a hard object.  Treatment for this condition typically requires surgery by an eye surgeon (ophthalmologist). This information is not intended to replace advice given to you by your health care provider. Make sure you discuss any questions you have with your health care provider. Document Released: 07/28/2011 Document Revised: 12/04/2017 Document Reviewed: 12/04/2017 Elsevier Patient Education  [place].
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
## 2019-02-21 NOTE — Progress Notes (Signed)
Called to see patient for new L eye pain. Patient seen and examined. Pain described as shooting and within the eye, some relief with morphine administered prior to my arrival. L eye with stable visual acuity (colors/light). Sticky discharge of L eye, but lid able to be opened partially. Sclera without injection. L pupil unable to be assessed. +light sensitivity b/l, L>R. R pupil round and reactive to light. Notified Dr. Alanda Slim with Ophtho, recommendations at this time for one time dose of tetracaine ophthalmic drops and continued pain control. Added a breakthrough 4mg  dose of morphine, one time, in addition to available PRN meds.   Jesusita Oka, MD General and [facility]

## 2019-02-21 NOTE — Progress Notes (Signed)
Visited patient on morning rounds. Brief encounter with patient and his mother. Both are very grateful for physical healing and understanding that healing will take time. Will follow up with patient at a later time and continue to provide spiritual care.  Rev. Excel.
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
## 2019-02-21 NOTE — Progress Notes (Signed)
Patient states he woke up to throbbing left eye and left jaw pain 9/10. He states this is a new pain. Morphine given as ordered. Trauma doctor on call paged and waiting for return call.

## 2019-02-22 MED ORDER — OFLOXACIN 0.3 % OP SOLN: 1.0000 [drp] | Freq: Four times a day (QID) | OPHTHALMIC | 0 refills | Status: DC

## 2019-02-22 MED ORDER — DOCUSATE SODIUM 100 MG PO CAPS: 100.0000 mg | ORAL_CAPSULE | Freq: Two times a day (BID) | ORAL | 0 refills | Status: AC

## 2019-02-22 MED ORDER — ACETAMINOPHEN 325 MG PO TABS: 650.0000 mg | ORAL_TABLET | Freq: Four times a day (QID) | ORAL | Status: DC | PRN

## 2019-02-22 MED ORDER — MUPIROCIN 2 % EX OINT: 1.0000 "application " | TOPICAL_OINTMENT | Freq: Two times a day (BID) | CUTANEOUS | 0 refills | Status: DC

## 2019-02-22 MED ORDER — PREDNISOLONE ACETATE 1 % OP SUSP: 1.0000 [drp] | Freq: Four times a day (QID) | OPHTHALMIC | 0 refills | Status: AC

## 2019-02-22 MED ORDER — OXYCODONE HCL 10 MG PO TABS: 5.0000 mg | ORAL_TABLET | Freq: Four times a day (QID) | ORAL | 0 refills | Status: DC | PRN

## 2019-02-22 MED ORDER — ADULT MULTIVITAMIN W/MINERALS CH: 1.0000 | ORAL_TABLET | Freq: Every day | ORAL | Status: AC

## 2019-02-22 MED ORDER — ATROPINE SULFATE 1 % OP SOLN: 1.0000 [drp] | Freq: Every day | OPHTHALMIC | 0 refills | Status: AC

## 2019-02-22 MED ORDER — HYPROMELLOSE (GONIOSCOPIC) 2.5 % OP SOLN: 1.0000 [drp] | OPHTHALMIC | 0 refills | Status: AC | PRN

## 2019-02-22 MED FILL — OFLOXACIN 0.3% EYE DROPS: 0.3 | 5 days supply | Qty: 5 | Fill #0

## 2019-02-22 MED FILL — oxyCODONE HCL 10 MG TABS: 10 | 7 days supply | Qty: 30 | Fill #0

## 2019-02-22 MED FILL — ATROPINE 1% EYE DROPS: 1 | 2 days supply | Qty: 2 | Fill #0

## 2019-02-22 MED FILL — GONAK 2.5 % SOLN: 2.5 | 15 days supply | Qty: 15 | Fill #0

## 2019-02-22 MED FILL — PREDNISOLONE AC 1% EYE DROP: 1 | 5 days supply | Qty: 5 | Fill #0
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
## 2019-02-22 NOTE — Progress Notes (Signed)
OT Cancellation Note  Patient Details Name: Robert Vaughan MRN: [id number] DOB: January 11, 1996   Cancelled Treatment:    Reason Eval/Treat Not Completed: Patient declined, no reason specified;Other (comment) Attempted to see pt this morning. Pt mom present with pt not responding to therapist questions. When asked if pt would work with OT pt nods "no" and mother agrees. Will check back as time allows.  [place], [place] [facility] [place] 02/22/2019, 12:14 PM
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
## 2019-02-22 NOTE — Progress Notes (Signed)
Discharged home with patient's mother  Driving him home. Discharged instructions,personal belongings given to patient with home medications from hospital pharmacy. Verbalized understanding of instructions

## 2019-02-22 NOTE — Progress Notes (Signed)
PT Cancellation Note  Patient Details Name: Robert Vaughan MRN: [id number] DOB: [birth year]   Cancelled Treatment:    Reason Eval/Treat Not Completed: Patient declined, no reason specified patient politely declines PT today, states he has gotten almost no sleep in the past 4 days and is scheduled to discharge (mom confirms) and both feel confident in mobility for return home. Educated that bumping up stairs in [facility] may be safest for now due to light sensitivity. Continue to recommend HHPT and L platform walker for home.    Deniece Ree PT, DPT, CBIS  Supplemental Physical Therapist [facility]    Pager [phone] Acute Rehab Office [phone]

## 2019-02-25 ENCOUNTER — Other Ambulatory Visit: Payer: Self-pay

## 2019-02-25 ENCOUNTER — Ambulatory Visit (HOSPITAL_BASED_OUTPATIENT_CLINIC_OR_DEPARTMENT_OTHER)
Admission: RE | Admit: 2019-02-25 | Discharge: 2019-02-25 | Disposition: A | Discharge status: Home / Self Care | Payer: 59 | Source: Ambulatory Visit | Attending: Orthopedic Surgery | Admitting: Orthopedic Surgery

## 2019-02-25 ENCOUNTER — Other Ambulatory Visit (HOSPITAL_BASED_OUTPATIENT_CLINIC_OR_DEPARTMENT_OTHER): Payer: Self-pay | Admitting: Orthopedic Surgery

## 2019-02-25 DIAGNOSIS — R2242 Localized swelling, mass and lump, left lower limb: Secondary | ICD-10-CM | POA: Insufficient documentation

## 2019-03-22 ENCOUNTER — Emergency Department (HOSPITAL_COMMUNITY)
Admission: EM | Admit: 2019-03-22 | Discharge: 2019-03-22 | Disposition: A | Discharge status: Home / Self Care | Payer: Medicaid Other | Attending: Emergency Medicine | Admitting: Emergency Medicine

## 2019-03-22 ENCOUNTER — Encounter (HOSPITAL_COMMUNITY): Payer: Self-pay

## 2019-03-22 ENCOUNTER — Emergency Department (HOSPITAL_COMMUNITY): Payer: Medicaid Other

## 2019-03-22 ENCOUNTER — Other Ambulatory Visit: Payer: Self-pay

## 2019-03-22 DIAGNOSIS — Z79899 Other long term (current) drug therapy: Secondary | ICD-10-CM | POA: Insufficient documentation

## 2019-03-22 DIAGNOSIS — R1011 Right upper quadrant pain: Secondary | ICD-10-CM | POA: Insufficient documentation

## 2019-03-22 LAB — COMPREHENSIVE METABOLIC PANEL
ALT: 14 U/L (ref 0–44)
AST: 12 U/L — ABNORMAL LOW (ref 15–41)
Albumin: 4.2 g/dL (ref 3.5–5.0)
Alkaline Phosphatase: 70 U/L (ref 38–126)
Anion gap: 10 (ref 5–15)
BUN: 10 mg/dL (ref 6–20)
CO2: 25 mmol/L (ref 22–32)
Calcium: 9.3 mg/dL (ref 8.9–10.3)
Chloride: 105 mmol/L (ref 98–111)
Creatinine, Ser: 1.09 mg/dL (ref 0.61–1.24)
GFR calc Af Amer: >60 mL/min (ref >60)
GFR calc non Af Amer: >60 mL/min (ref >60)
Glucose, Bld: 92 mg/dL (ref 70–99)
Potassium: 3.9 mmol/L (ref 3.5–5.1)
Sodium: 140 mmol/L (ref 135–145)
Total Bilirubin: 0.8 mg/dL (ref 0.3–1.2)
Total Protein: 7 g/dL (ref 6.5–8.1)

## 2019-03-22 LAB — URINALYSIS, ROUTINE W REFLEX MICROSCOPIC
Bilirubin Urine: NEGATIVE
Glucose, UA: NEGATIVE mg/dL
Hgb urine dipstick: NEGATIVE
Ketones, ur: NEGATIVE mg/dL
Leukocytes,Ua: NEGATIVE
Nitrite: NEGATIVE
Protein, ur: NEGATIVE mg/dL
Specific Gravity, Urine: 1.027 (ref 1.005–1.030)
pH: 5 (ref 5.0–8.0)

## 2019-03-22 LAB — CBC
HCT: 43.8 % (ref 39.0–52.0)
Hemoglobin: 14.6 g/dL (ref 13.0–17.0)
MCH: 30.9 pg (ref 26.0–34.0)
MCHC: 33.3 g/dL (ref 30.0–36.0)
MCV: 92.8 fL (ref 80.0–100.0)
Platelets: 207 10*3/uL (ref 150–400)
RBC: 4.72 MIL/uL (ref 4.22–5.81)
RDW: 12 % (ref 11.5–15.5)
WBC: 6.9 10*3/uL (ref 4.0–10.5)
nRBC: 0 % (ref 0.0–0.2)

## 2019-03-22 LAB — LIPASE, BLOOD: Lipase: 24 U/L (ref 11–51)

## 2019-03-22 MED ORDER — SODIUM CHLORIDE 0.9% FLUSH: 3.0000 mL | Freq: Once | INTRAVENOUS | Status: DC

## 2019-03-22 MED ORDER — IOHEXOL 300 MG/ML  SOLN
100.0000 mL | Freq: Once | INTRAMUSCULAR | Status: AC | PRN
  Administered 2019-03-22: 03:00:00 100 mL via INTRAVENOUS

## 2019-03-22 NOTE — ED Triage Notes (Signed)
Pt from home bib POV due to recent onset of sharp RUQ abdominal pain. Patient denies N/V/D, loss of appetite, or lifestyle changes.

## 2019-03-22 NOTE — Discharge Instructions (Addendum)
Ibuprofen 600 mg every 6 hours as needed for pain.  Follow-up with your primary doctor if not improving in the next few days, and return to the ER if you develop worsening pain, high fever, bloody stool, or other new and concerning symptoms.
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
## 2020-01-24 ENCOUNTER — Emergency Department (HOSPITAL_COMMUNITY): Payer: Medicaid Other

## 2020-01-24 ENCOUNTER — Encounter (HOSPITAL_COMMUNITY): Payer: Self-pay | Admitting: Emergency Medicine

## 2020-01-24 ENCOUNTER — Emergency Department (HOSPITAL_COMMUNITY)
Admission: EM | Admit: 2020-01-24 | Discharge: 2020-01-24 | Disposition: A | Discharge status: Home / Self Care | Payer: Medicaid Other | Attending: Emergency Medicine | Admitting: Emergency Medicine

## 2020-01-24 ENCOUNTER — Other Ambulatory Visit: Payer: Self-pay

## 2020-01-24 DIAGNOSIS — S299XXA Unspecified injury of thorax, initial encounter: Secondary | ICD-10-CM | POA: Insufficient documentation

## 2020-01-24 DIAGNOSIS — M791 Myalgia, unspecified site: Secondary | ICD-10-CM | POA: Insufficient documentation

## 2020-01-24 DIAGNOSIS — Y929 Unspecified place or not applicable: Secondary | ICD-10-CM | POA: Insufficient documentation

## 2020-01-24 DIAGNOSIS — R197 Diarrhea, unspecified: Secondary | ICD-10-CM | POA: Insufficient documentation

## 2020-01-24 DIAGNOSIS — R0781 Pleurodynia: Secondary | ICD-10-CM

## 2020-01-24 DIAGNOSIS — Y939 Activity, unspecified: Secondary | ICD-10-CM | POA: Insufficient documentation

## 2020-01-24 DIAGNOSIS — X58XXXA Exposure to other specified factors, initial encounter: Secondary | ICD-10-CM | POA: Insufficient documentation

## 2020-01-24 DIAGNOSIS — Y999 Unspecified external cause status: Secondary | ICD-10-CM | POA: Insufficient documentation

## 2020-01-24 DIAGNOSIS — S2231XG Fracture of one rib, right side, subsequent encounter for fracture with delayed healing: Secondary | ICD-10-CM | POA: Insufficient documentation

## 2020-01-24 DIAGNOSIS — Z79899 Other long term (current) drug therapy: Secondary | ICD-10-CM | POA: Insufficient documentation

## 2020-01-24 MED ORDER — CYCLOBENZAPRINE HCL 10 MG PO TABS: 10.0000 mg | ORAL_TABLET | Freq: Every day | ORAL | 0 refills | Status: AC

## 2020-01-24 NOTE — ED Triage Notes (Signed)
Pt reports right sided rib pain for the last 2-3 days no recent injury. Pt denies any cp, cough, fever or chills.

## 2020-01-24 NOTE — Discharge Instructions (Addendum)
I recommend a combination of tylenol and ibuprofen for management of your pain. You can take a low dose of both at the same time. I recommend 325 mg of Tylenol combined with 400 mg of ibuprofen. This is one regular Tylenol and two regular ibuprofen. You can take these 2-3 times for day for your pain. Please try to take these medications with a small amount of food as well to prevent upsetting your stomach.  I am prescribing you a strong muscle relaxer called flexeril. Please only take this medication once in the evening with dinner. This medication can make you quite drowsy. Do not mix it with alcohol. Do not drive a vehicle after taking it.   Please follow-up with [place] [facility].  If your symptoms worsen please return to the emergency department for reevaluation.  It was a pleasure to meet you.
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
## 2020-04-17 ENCOUNTER — Emergency Department (HOSPITAL_COMMUNITY)
Admission: EM | Admit: 2020-04-17 | Discharge: 2020-04-18 | Disposition: A | Discharge status: Home / Self Care | Payer: 59 | Attending: Emergency Medicine | Admitting: Emergency Medicine

## 2020-04-17 ENCOUNTER — Encounter (HOSPITAL_COMMUNITY): Payer: Self-pay

## 2020-04-17 ENCOUNTER — Other Ambulatory Visit: Payer: Self-pay

## 2020-04-17 DIAGNOSIS — R109 Unspecified abdominal pain: Secondary | ICD-10-CM

## 2020-04-17 DIAGNOSIS — R1032 Left lower quadrant pain: Secondary | ICD-10-CM | POA: Insufficient documentation

## 2020-04-17 DIAGNOSIS — N503 Cyst of epididymis: Secondary | ICD-10-CM

## 2020-04-17 LAB — CBC
HCT: 41.8 % (ref 39.0–52.0)
Hemoglobin: 14.2 g/dL (ref 13.0–17.0)
MCH: 30.5 pg (ref 26.0–34.0)
MCHC: 34 g/dL (ref 30.0–36.0)
MCV: 89.7 fL (ref 80.0–100.0)
Platelets: 221 10*3/uL (ref 150–400)
RBC: 4.66 MIL/uL (ref 4.22–5.81)
RDW: 11.9 % (ref 11.5–15.5)
WBC: 6 10*3/uL (ref 4.0–10.5)
nRBC: 0 % (ref 0.0–0.2)

## 2020-04-17 LAB — COMPREHENSIVE METABOLIC PANEL
ALT: 18 U/L (ref 0–44)
AST: 19 U/L (ref 15–41)
Albumin: 4.6 g/dL (ref 3.5–5.0)
Alkaline Phosphatase: 48 U/L (ref 38–126)
Anion gap: 10 (ref 5–15)
BUN: 14 mg/dL (ref 6–20)
CO2: 23 mmol/L (ref 22–32)
Calcium: 8.9 mg/dL (ref 8.9–10.3)
Chloride: 106 mmol/L (ref 98–111)
Creatinine, Ser: 1 mg/dL (ref 0.61–1.24)
GFR, Estimated: >60 mL/min (ref >60)
Glucose, Bld: 97 mg/dL (ref 70–99)
Potassium: 3.7 mmol/L (ref 3.5–5.1)
Sodium: 139 mmol/L (ref 135–145)
Total Bilirubin: 0.5 mg/dL (ref 0.3–1.2)
Total Protein: 7.3 g/dL (ref 6.5–8.1)

## 2020-04-17 LAB — LIPASE, BLOOD: Lipase: 24 U/L (ref 11–51)

## 2020-04-17 NOTE — ED Notes (Signed)
PT MADE AWARE OF URINE SAMPLE

## 2020-04-17 NOTE — ED Triage Notes (Signed)
Patient arrived stating that he has left sided abdominal pain that started around 11am today. Reports some nausea. No vomiting or diarrhea.

## 2020-04-18 ENCOUNTER — Encounter (HOSPITAL_COMMUNITY): Payer: Self-pay

## 2020-04-18 ENCOUNTER — Emergency Department (HOSPITAL_COMMUNITY): Payer: 59

## 2020-04-18 LAB — URINALYSIS, ROUTINE W REFLEX MICROSCOPIC
Bilirubin Urine: NEGATIVE
Glucose, UA: NEGATIVE mg/dL
Hgb urine dipstick: NEGATIVE
Ketones, ur: NEGATIVE mg/dL
Leukocytes,Ua: NEGATIVE
Nitrite: NEGATIVE
Protein, ur: NEGATIVE mg/dL
Specific Gravity, Urine: >1.046 — ABNORMAL HIGH (ref 1.005–1.030)
pH: 6 (ref 5.0–8.0)

## 2020-04-18 MED ORDER — IBUPROFEN 200 MG PO TABS
600.0000 mg | ORAL_TABLET | Freq: Once | ORAL | Status: AC
  Administered 2020-04-18: 600 mg via ORAL
  Filled 2020-04-18: qty 3

## 2020-04-18 MED ORDER — IOHEXOL 300 MG/ML  SOLN
100.0000 mL | Freq: Once | INTRAMUSCULAR | Status: AC | PRN
  Administered 2020-04-18: 100 mL via INTRAVENOUS
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
## 2020-04-18 NOTE — Discharge Instructions (Addendum)
Your evaluation today has been reassuring, I do not see evidence of hernia, lab work overall looks good.  It is possible that you could have had a hernia that reduced itself.  Could also be muscle strain of the abdominal wall.  Your ultrasound did show a small epididymal cyst, please follow-up with urology regarding this.  You may use ibuprofen and Tylenol for pain.  If you have continued pain or swelling of the scrotum or develop protrusion of the abdominal wall suggestive of a hernia you can follow-up as an outpatient with general surgery.  If you develop similar abdominal pain or swelling or pain in your scrotum and are not able to pass gas or bowels, have vomiting or fever you should return to the emergency department.

## 2020-08-06 ENCOUNTER — Emergency Department (HOSPITAL_BASED_OUTPATIENT_CLINIC_OR_DEPARTMENT_OTHER)
Admission: EM | Admit: 2020-08-06 | Discharge: 2020-08-06 | Disposition: A | Discharge status: Home / Self Care | Payer: Medicaid Other | Attending: Emergency Medicine | Admitting: Emergency Medicine

## 2020-08-06 ENCOUNTER — Encounter (HOSPITAL_BASED_OUTPATIENT_CLINIC_OR_DEPARTMENT_OTHER): Payer: Self-pay

## 2020-08-06 ENCOUNTER — Other Ambulatory Visit: Payer: Self-pay

## 2020-08-06 DIAGNOSIS — K047 Periapical abscess without sinus: Secondary | ICD-10-CM | POA: Insufficient documentation

## 2020-08-06 MED ORDER — AMOXICILLIN-POT CLAVULANATE 875-125 MG PO TABS: 1.0000 | ORAL_TABLET | Freq: Two times a day (BID) | ORAL | 0 refills | Status: AC

## 2020-08-06 MED ORDER — HYDROCODONE-ACETAMINOPHEN 7.5-325 MG PO TABS: 1.0000 | ORAL_TABLET | Freq: Four times a day (QID) | ORAL | 0 refills | Status: AC | PRN
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
## 2020-08-06 NOTE — Discharge Instructions (Signed)
You have been seen and discharged from the emergency department.  Take antibiotic and pain medicine as directed.  Do not mix this pain medication with alcohol or other sedating medications. Do not drive or do heavy physical activity and to know how this medication affects you.  It may cause drowsiness.  Follow-up with your primary provider and to be seen by a dentist for reevaluation and definitive treatment. Take home medications as prescribed. If you have any worsening symptoms or further concerns for health please return to an emergency department for further evaluation.
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
## 2021-01-09 ENCOUNTER — Encounter (HOSPITAL_BASED_OUTPATIENT_CLINIC_OR_DEPARTMENT_OTHER): Payer: Self-pay | Admitting: Obstetrics and Gynecology

## 2021-01-09 ENCOUNTER — Emergency Department (HOSPITAL_BASED_OUTPATIENT_CLINIC_OR_DEPARTMENT_OTHER): Payer: Self-pay | Admitting: Radiology

## 2021-01-09 ENCOUNTER — Other Ambulatory Visit: Payer: Self-pay

## 2021-01-09 ENCOUNTER — Emergency Department (HOSPITAL_BASED_OUTPATIENT_CLINIC_OR_DEPARTMENT_OTHER)
Admission: EM | Admit: 2021-01-09 | Discharge: 2021-01-09 | Disposition: A | Discharge status: Home / Self Care | Payer: Self-pay | Attending: Emergency Medicine | Admitting: Emergency Medicine

## 2021-01-09 DIAGNOSIS — Y990 Civilian activity done for income or pay: Secondary | ICD-10-CM | POA: Insufficient documentation

## 2021-01-09 DIAGNOSIS — X500XXA Overexertion from strenuous movement or load, initial encounter: Secondary | ICD-10-CM | POA: Insufficient documentation

## 2021-01-09 DIAGNOSIS — M940 Chondrocostal junction syndrome [Tietze]: Secondary | ICD-10-CM | POA: Insufficient documentation

## 2021-01-09 DIAGNOSIS — R079 Chest pain, unspecified: Secondary | ICD-10-CM

## 2021-01-09 LAB — BASIC METABOLIC PANEL
Anion gap: 15 (ref 5–15)
BUN: 17 mg/dL (ref 6–20)
CO2: 18 mmol/L — ABNORMAL LOW (ref 22–32)
Calcium: 9.3 mg/dL (ref 8.9–10.3)
Chloride: 104 mmol/L (ref 98–111)
Creatinine, Ser: 1.23 mg/dL (ref 0.61–1.24)
GFR, Estimated: >60 mL/min (ref >60)
Glucose, Bld: 95 mg/dL (ref 70–99)
Potassium: 3.8 mmol/L (ref 3.5–5.1)
Sodium: 137 mmol/L (ref 135–145)

## 2021-01-09 LAB — CBC
HCT: 45 % (ref 39.0–52.0)
Hemoglobin: 15.3 g/dL (ref 13.0–17.0)
MCH: 30.5 pg (ref 26.0–34.0)
MCHC: 34 g/dL (ref 30.0–36.0)
MCV: 89.6 fL (ref 80.0–100.0)
Platelets: 239 10*3/uL (ref 150–400)
RBC: 5.02 MIL/uL (ref 4.22–5.81)
RDW: 12.6 % (ref 11.5–15.5)
WBC: 9.6 10*3/uL (ref 4.0–10.5)
nRBC: 0 % (ref 0.0–0.2)

## 2021-01-09 LAB — TROPONIN I (HIGH SENSITIVITY): Troponin I (High Sensitivity): <2 ng/L (ref <18)

## 2021-01-09 MED ORDER — LIDOCAINE 5 % EX PTCH: 1.0000 | MEDICATED_PATCH | CUTANEOUS | 0 refills | Status: AC

## 2021-01-09 MED ORDER — IBUPROFEN 800 MG PO TABS
800.0000 mg | ORAL_TABLET | Freq: Once | ORAL | Status: AC
  Administered 2021-01-09: 800 mg via ORAL
  Filled 2021-01-09: qty 1

## 2021-01-09 MED ORDER — IBUPROFEN 800 MG PO TABS: 800.0000 mg | ORAL_TABLET | Freq: Four times a day (QID) | ORAL | 0 refills | Status: AC | PRN

## 2021-01-09 MED ORDER — LIDOCAINE 5 % EX PTCH
1.0000 | MEDICATED_PATCH | CUTANEOUS | Status: DC
  Administered 2021-01-09: 1 via TRANSDERMAL
  Filled 2021-01-09: qty 1
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
## 2021-01-09 NOTE — ED Triage Notes (Signed)
Patient reports to the ER for chest pain. Patient reports he felt something pop and is unsure if it is MS or cardiac feeling

## 2021-01-09 NOTE — Discharge Instructions (Addendum)
Decrease heavy lifting until pain improves

## 2021-05-31 ENCOUNTER — Other Ambulatory Visit: Payer: Self-pay

## 2021-05-31 ENCOUNTER — Encounter (HOSPITAL_BASED_OUTPATIENT_CLINIC_OR_DEPARTMENT_OTHER): Payer: Self-pay

## 2021-05-31 ENCOUNTER — Emergency Department (HOSPITAL_BASED_OUTPATIENT_CLINIC_OR_DEPARTMENT_OTHER)
Admission: EM | Admit: 2021-05-31 | Discharge: 2021-06-01 | Disposition: A | Discharge status: Home / Self Care | Payer: BC Managed Care – PPO | Attending: Emergency Medicine | Admitting: Emergency Medicine

## 2021-05-31 DIAGNOSIS — R1011 Right upper quadrant pain: Secondary | ICD-10-CM | POA: Diagnosis present

## 2021-05-31 DIAGNOSIS — K824 Cholesterolosis of gallbladder: Secondary | ICD-10-CM | POA: Diagnosis not present

## 2021-05-31 LAB — URINALYSIS, ROUTINE W REFLEX MICROSCOPIC
Bilirubin Urine: NEGATIVE
Glucose, UA: NEGATIVE mg/dL
Hgb urine dipstick: NEGATIVE
Ketones, ur: NEGATIVE mg/dL
Leukocytes,Ua: NEGATIVE
Nitrite: NEGATIVE
Specific Gravity, Urine: 1.026 (ref 1.005–1.030)
pH: 7 (ref 5.0–8.0)

## 2021-05-31 LAB — COMPREHENSIVE METABOLIC PANEL
ALT: 15 U/L (ref 0–44)
AST: 15 U/L (ref 15–41)
Albumin: 4.7 g/dL (ref 3.5–5.0)
Alkaline Phosphatase: 50 U/L (ref 38–126)
Anion gap: 9 (ref 5–15)
BUN: 17 mg/dL (ref 6–20)
CO2: 26 mmol/L (ref 22–32)
Calcium: 9.1 mg/dL (ref 8.9–10.3)
Chloride: 105 mmol/L (ref 98–111)
Creatinine, Ser: 1.03 mg/dL (ref 0.61–1.24)
GFR, Estimated: >60 mL/min (ref >60)
Glucose, Bld: 89 mg/dL (ref 70–99)
Potassium: 4.2 mmol/L (ref 3.5–5.1)
Sodium: 140 mmol/L (ref 135–145)
Total Bilirubin: 0.4 mg/dL (ref 0.3–1.2)
Total Protein: 7.4 g/dL (ref 6.5–8.1)

## 2021-05-31 LAB — CBC
HCT: 45.1 % (ref 39.0–52.0)
Hemoglobin: 14.9 g/dL (ref 13.0–17.0)
MCH: 29.7 pg (ref 26.0–34.0)
MCHC: 33 g/dL (ref 30.0–36.0)
MCV: 89.8 fL (ref 80.0–100.0)
Platelets: 226 10*3/uL (ref 150–400)
RBC: 5.02 MIL/uL (ref 4.22–5.81)
RDW: 12.3 % (ref 11.5–15.5)
WBC: 6.5 10*3/uL (ref 4.0–10.5)
nRBC: 0 % (ref 0.0–0.2)

## 2021-05-31 LAB — LIPASE, BLOOD: Lipase: 18 U/L (ref 11–51)

## 2021-05-31 NOTE — ED Triage Notes (Signed)
Pt c/o RUQ abd pain that is worse after meals. Pt reports associated nausea.

## 2021-06-01 ENCOUNTER — Emergency Department (HOSPITAL_BASED_OUTPATIENT_CLINIC_OR_DEPARTMENT_OTHER): Payer: BC Managed Care – PPO

## 2021-06-01 MED ORDER — ONDANSETRON 4 MG PO TBDP: 4.0000 mg | ORAL_TABLET | Freq: Three times a day (TID) | ORAL | 0 refills | Status: DC | PRN

## 2021-06-01 MED ORDER — MAALOX MAX 400-400-40 MG/5ML PO SUSP: 10.0000 mL | Freq: Four times a day (QID) | ORAL | 0 refills | Status: AC | PRN

## 2021-06-01 MED ORDER — PANTOPRAZOLE SODIUM 40 MG PO TBEC: 40.0000 mg | DELAYED_RELEASE_TABLET | Freq: Every day | ORAL | 0 refills | Status: AC

## 2021-06-01 NOTE — Discharge Instructions (Signed)

## 2021-06-01 NOTE — ED Provider Notes (Signed)
Emergency Department Provider Note   I have reviewed the triage vital signs and the nursing notes.   HISTORY  Chief Complaint Abdominal Pain   HPI Robert Vaughan is a 26 y.o. male presents to the emergency department with epigastric and right upper quadrant abdominal pain.  Symptoms worsening over the past several days.  He has constant discomfort with pain worsening with meals.  He has some associated nausea but no vomiting.  No diarrhea.  He continues to have bowel movements.  No fevers or chills.  No similar pain in the past.  Pain is tightness but also cramping in quality. No radiation.    History reviewed. No pertinent past medical history.  Review of Systems  Constitutional: No fever/chills Eyes: No visual changes. ENT: No sore throat. Cardiovascular: Denies chest pain. Respiratory: Denies shortness of breath. Gastrointestinal: Positive epigastric abdominal pain. Positive nausea, no vomiting.  No diarrhea.  No constipation. Musculoskeletal: Negative for back pain. Skin: Negative for rash.  ____________________________________________   PHYSICAL EXAM:  VITAL SIGNS: ED Triage Vitals  Enc Vitals Group     BP 05/31/21 2107 114/71     Pulse Rate 05/31/21 2107 64     Resp 05/31/21 2107 20     Temp 05/31/21 2107 98.5 F (36.9 C)     Temp Source 05/31/21 2107 Oral     SpO2 05/31/21 2107 96 %     Weight 05/31/21 2108 180 lb (81.6 kg)     Height 05/31/21 2108 6' (1.829 m)   Constitutional: Alert and oriented. Well appearing and in no acute distress. Eyes: Conjunctivae are normal.  Head: Atraumatic. Nose: No congestion/rhinnorhea. Mouth/Throat: Mucous membranes are moist.  Neck: No stridor.  Cardiovascular: Normal rate, regular rhythm. Good peripheral circulation. Grossly normal heart sounds.   Respiratory: Normal respiratory effort.  No retractions. Lungs CTAB. Gastrointestinal: Soft with mild epigastric and RUQ tenderness. Negative Murphy's sign. No lower  abdominal tenderness. No distention.  Musculoskeletal: No lower extremity tenderness nor edema. No gross deformities of extremities. Neurologic:  Normal speech and language. No gross focal neurologic deficits are appreciated.  Skin:  Skin is warm, dry and intact. No rash noted.  ____________________________________________   LABS (all labs ordered are listed, but only abnormal results are displayed)  Labs Reviewed  URINALYSIS, ROUTINE W REFLEX MICROSCOPIC - Abnormal; Notable for the following components:      Result Value   APPearance HAZY (*)    Protein, ur TRACE (*)    All other components within normal limits  LIPASE, BLOOD  COMPREHENSIVE METABOLIC PANEL  CBC   ____________________________________________  RADIOLOGY  US Abdomen Limited RUQ (LIVER/GB)  Result Date: 06/01/2021 CLINICAL DATA:  Right upper quadrant pain x1 week with nausea. EXAM: ULTRASOUND ABDOMEN LIMITED RIGHT UPPER QUADRANT COMPARISON:  None. FINDINGS: Gallbladder: A 5 mm x 4 mm x 5 mm nonshadowing echogenic gallbladder polyp is seen along the nondependent wall of the gallbladder lumen. No gallstones or wall thickening visualized (2.5 mm. No sonographic Murphy sign noted by sonographer. Common bile duct: Diameter: 3.1 mm Liver: No focal lesion identified. Within normal limits in parenchymal echogenicity. Portal vein is patent on color Doppler imaging with normal direction of blood flow towards the liver. Other: None. IMPRESSION: 5 mm gallbladder polyp, likely benign. No additional follow-up or imaging is recommended. This recommendation follows ACR consensus guidelines: White Paper of the ACR Incidental Findings Committee II on Gallbladder and Biliary Findings. J Am Coll Radiol 2013:;10:953-956. Electronically Signed   By: Joyce Gross.D.  On: 06/01/2021 00:47    ____________________________________________   PROCEDURES  Procedure(s) performed:   Procedures  None   ____________________________________________   INITIAL IMPRESSION / ASSESSMENT AND PLAN / ED COURSE  Pertinent labs & imaging results that were available during my care of the patient were reviewed by me and considered in my medical decision making (see chart for details).   This patient is Presenting for Evaluation of abdominal pain, which does require a range of treatment options, and is a complaint that involves a high risk of morbidity and mortality.  The Differential Diagnoses includes but is not exclusive to acute cholecystitis, intrathoracic causes for epigastric abdominal pain, gastritis, duodenitis, pancreatitis, small bowel or large bowel obstruction, abdominal aortic aneurysm, hernia, gastritis, etc.   I did Additional Historical Information from SO at bedside. Confirms portions of documented history.   I decided to review pertinent External Data, and in summary no recent ED visits for similar pain or recent abdominal imaging.    Clinical Laboratory Tests Ordered, included MP.  No acute kidney injury.  LFTs and bilirubin are normal.  Alk phos is normal.  Lipase is normal.  Patient without leukocytosis or anemia.  No evidence of urinary tract infection.   Radiologic Tests Ordered, included right upper quadrant ultrasound.  Gallbladder polyp noted but no secondary findings of acute cholecystitis.  I did independently evaluate the images in addition to radiology interpretation.    Reevaluation with update and discussion with patient. He is resting comfortably.  No evidence of acute cholecystitis.  Gallbladder polyp but no follow-up recommended by radiology.  Plan for PPI and Maalox.  Discussed need for PCP follow-up as if symptoms continue he may require referral on to GI for consideration of upper endoscopy.   Medical Decision Making: Summary:  Patient presents to the emergency department with epigastric abdominal pain worse with food.  Differential and work-up as above.  Abdomen  is diffusely soft and nontender.  Would not consider CT imaging of the abdomen pelvis given his exam and lab work.  Ultrasound shows no evidence of acute cholecystitis or choledocholithiasis.  Lab work supports this.  Plan for symptom management, PPI, Maalox, PCP follow-up with plan for GI referral from that standpoint should symptoms recur/continue.   Disposition: discharge   ____________________________________________  FINAL CLINICAL IMPRESSION(S) / ED DIAGNOSES  Final diagnoses:  RUQ abdominal pain  Gallbladder polyp     NEW OUTPATIENT MEDICATIONS STARTED DURING THIS VISIT:  Discharge Medication List as of 06/01/2021  1:24 AM     START taking these medications   Details  alum & mag hydroxide-simeth (MAALOX MAX) 400-400-40 MG/5ML suspension Take 10 mLs by mouth every 6 (six) hours as needed for indigestion., Starting Sat 06/01/2021, Normal    ondansetron (ZOFRAN-ODT) 4 MG disintegrating tablet Take 1 tablet (4 mg total) by mouth every 8 (eight) hours as needed for nausea or vomiting., Starting Sat 06/01/2021, Normal    pantoprazole (PROTONIX) 40 MG tablet Take 1 tablet (40 mg total) by mouth daily., Starting Sat 06/01/2021, Until Mon 07/01/2021, Normal        Note:  This document was prepared using Dragon voice recognition software and may include unintentional dictation errors.  Nanda Quinton, MD, [facility]    Brookes Craine, Wonda Olds, MD 06/01/21 [phone]

## 2021-06-11 ENCOUNTER — Emergency Department (HOSPITAL_BASED_OUTPATIENT_CLINIC_OR_DEPARTMENT_OTHER)
Admission: EM | Admit: 2021-06-11 | Discharge: 2021-06-12 | Disposition: A | Discharge status: Home / Self Care | Payer: BC Managed Care – PPO | Attending: Emergency Medicine | Admitting: Emergency Medicine

## 2021-06-11 ENCOUNTER — Encounter (HOSPITAL_BASED_OUTPATIENT_CLINIC_OR_DEPARTMENT_OTHER): Payer: Self-pay

## 2021-06-11 ENCOUNTER — Other Ambulatory Visit: Payer: Self-pay

## 2021-06-11 DIAGNOSIS — B349 Viral infection, unspecified: Secondary | ICD-10-CM | POA: Diagnosis not present

## 2021-06-11 DIAGNOSIS — Z20822 Contact with and (suspected) exposure to covid-19: Secondary | ICD-10-CM | POA: Insufficient documentation

## 2021-06-11 DIAGNOSIS — R509 Fever, unspecified: Secondary | ICD-10-CM | POA: Diagnosis present

## 2021-06-11 DIAGNOSIS — Z79899 Other long term (current) drug therapy: Secondary | ICD-10-CM | POA: Diagnosis not present

## 2021-06-11 LAB — RESP PANEL BY RT-PCR (FLU A&B, COVID) ARPGX2
Influenza A by PCR: NEGATIVE
Influenza B by PCR: NEGATIVE
SARS Coronavirus 2 by RT PCR: NEGATIVE

## 2021-06-11 LAB — GROUP A STREP BY PCR: Group A Strep by PCR: NOT DETECTED

## 2021-06-11 MED ORDER — ACETAMINOPHEN 325 MG PO TABS
650.0000 mg | ORAL_TABLET | Freq: Once | ORAL | Status: AC | PRN
  Administered 2021-06-11: 22:00:00 650 mg via ORAL
  Filled 2021-06-11: qty 2

## 2021-06-11 NOTE — ED Triage Notes (Signed)
Patient here POV from Home with Flu-Like Symptoms.  Patient has had Symptoms including Headache, Fever, Body Aches, Dry Cough for approximately 4 days.   NAD Noted during Triage. A&Ox4. Gcs 15. Ambulatory.

## 2021-06-12 MED ORDER — ONDANSETRON 4 MG PO TBDP: 4.0000 mg | ORAL_TABLET | Freq: Three times a day (TID) | ORAL | 0 refills | Status: AC | PRN

## 2021-06-12 MED ORDER — IBUPROFEN 400 MG PO TABS
600.0000 mg | ORAL_TABLET | Freq: Once | ORAL | Status: AC
  Administered 2021-06-12: 01:00:00 600 mg via ORAL
  Filled 2021-06-12: qty 1
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
## 2021-06-12 NOTE — Discharge Instructions (Addendum)
You may take over-the-counter medicine for symptomatic relief, such as Tylenol, Motrin, TheraFlu, Alka seltzer , black elderberry, etc. Please limit acetaminophen (Tylenol) to 4000 mg and Ibuprofen (Motrin, Advil, etc.) to 2400 mg for a 24hr period. Please note that other over-the-counter medicine may contain acetaminophen or ibuprofen as a component of their ingredients.   

## 2023-01-28 IMAGING — DX DG CHEST 2V
2 series · 2 of 2 positions shown · non-contrast
Comparison: 10/13/2020

CLINICAL DATA: Chest pain.  Felt something pop.

EXAM:
CHEST - 2 VIEW

[chest pa]
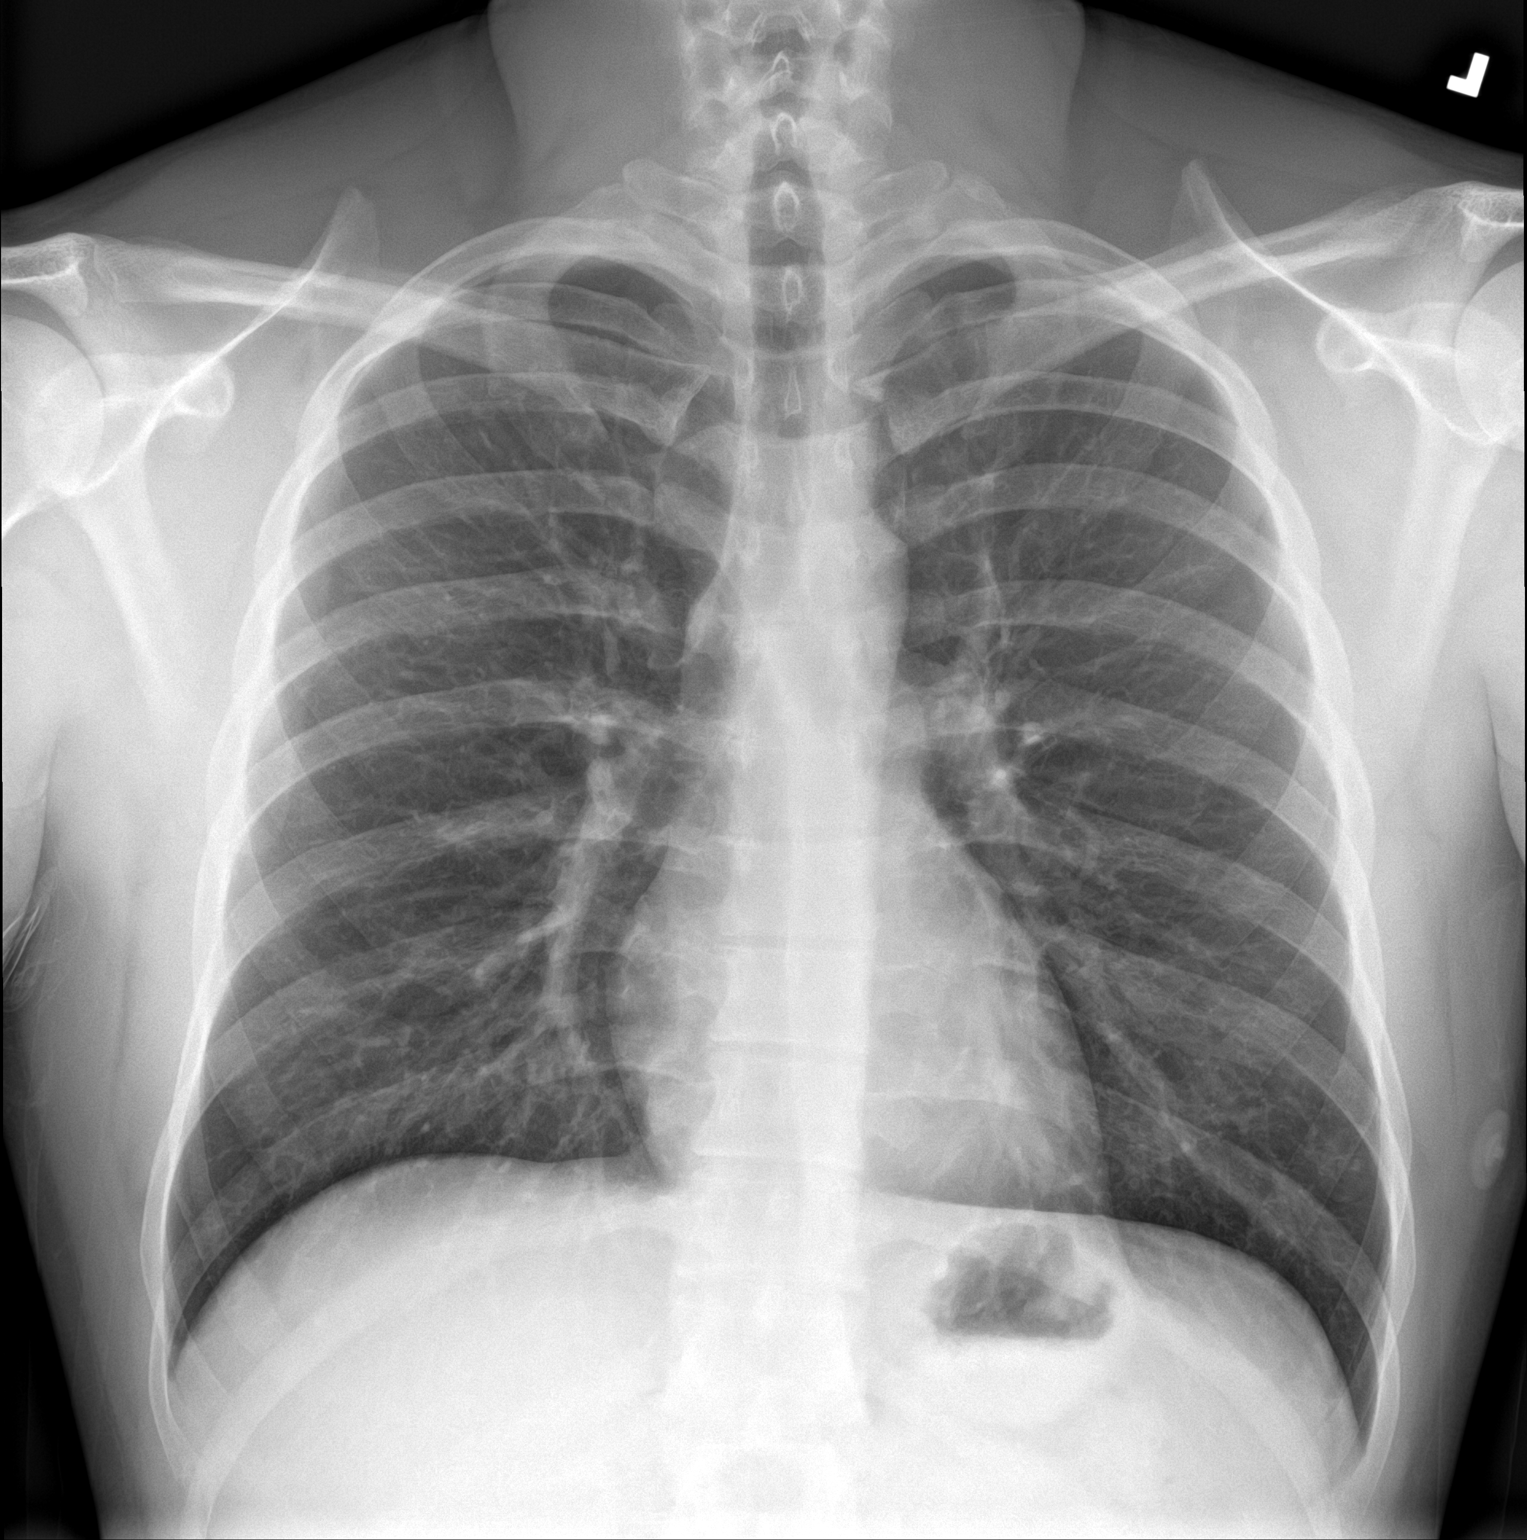

[chest lat]
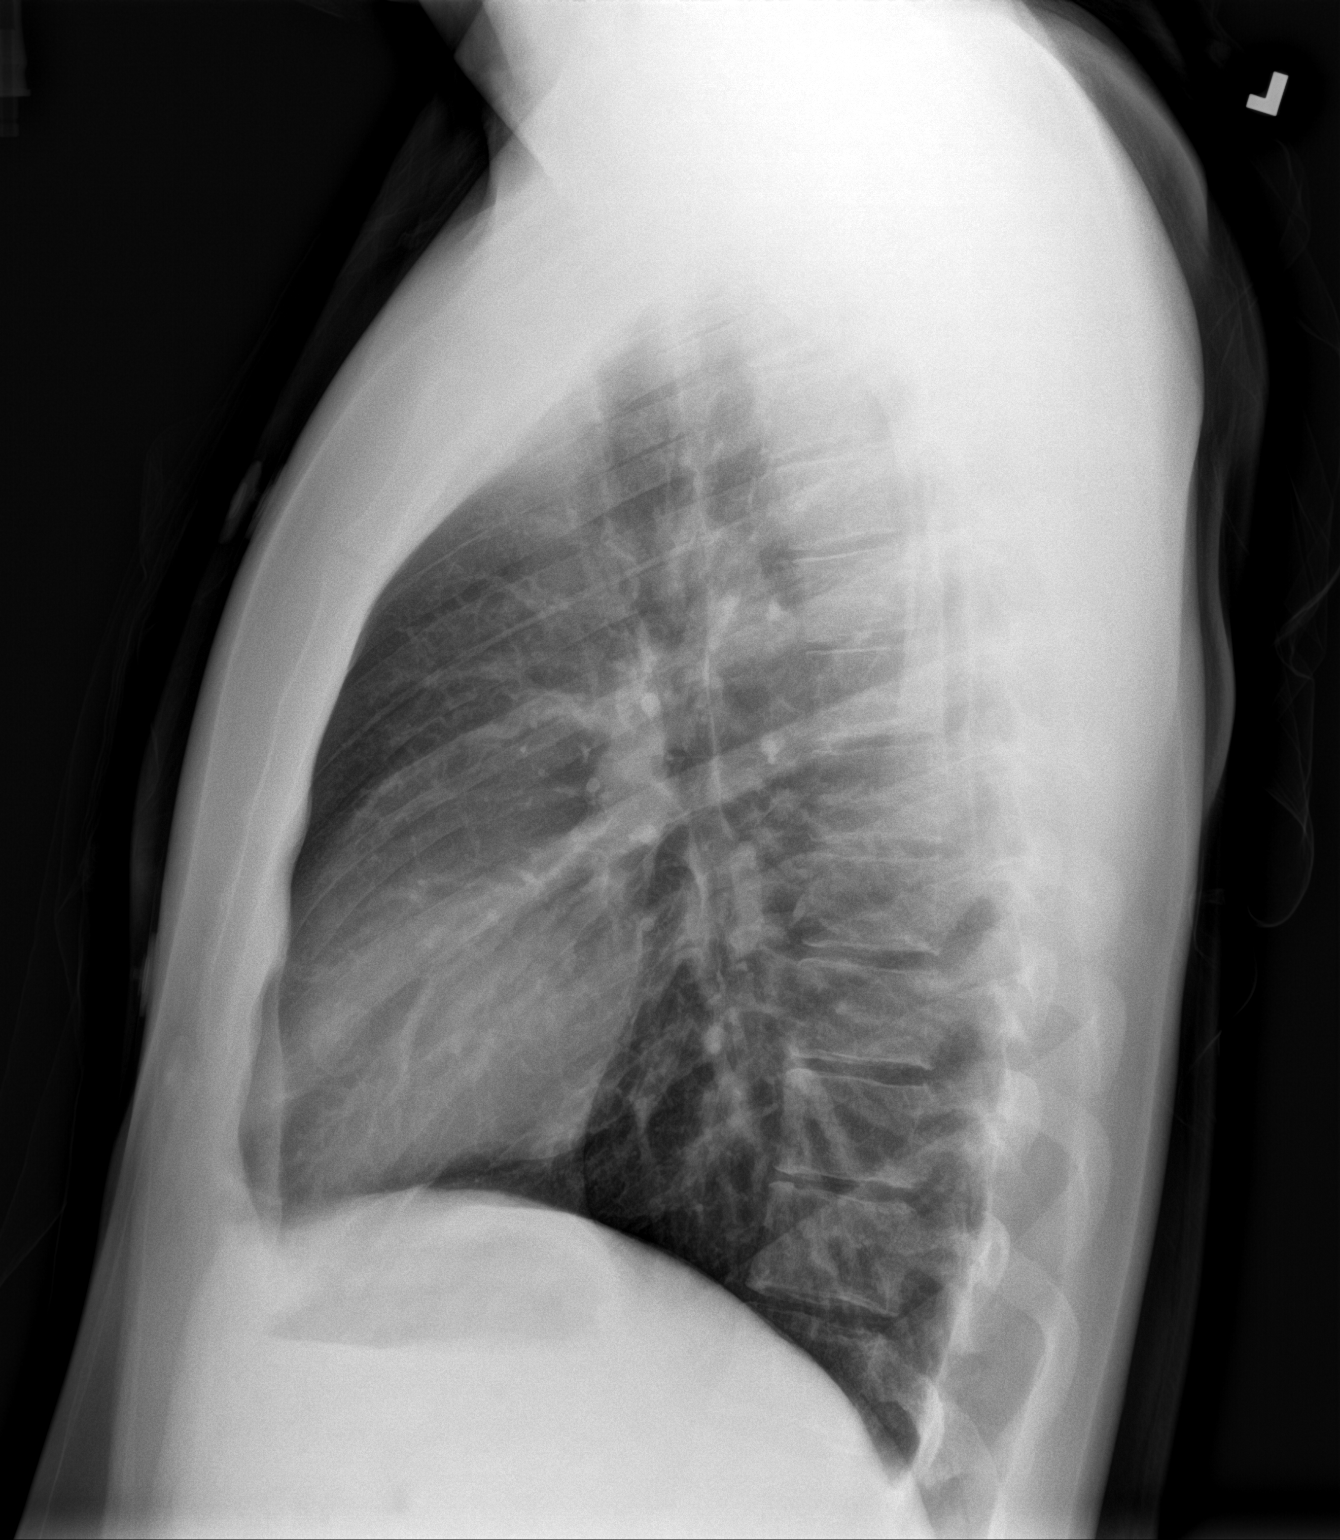

[2 of 2 positions shown; findings below may reference images not displayed]

FINDINGS: Heart size is normal. Mediastinal shadows are normal. The lungs are
clear. No bronchial thickening. No infiltrate, mass, effusion or
collapse. Pulmonary vascularity is normal. No bony abnormality.
IMPRESSION: Normal chest

## 2023-06-20 IMAGING — US US ABDOMEN LIMITED
1 series · 14 of 25 positions shown · non-contrast
Comparison: None.

CLINICAL DATA: Right upper quadrant pain x1 week with nausea.

EXAM:
ULTRASOUND ABDOMEN LIMITED RIGHT UPPER QUADRANT

[Series 1: us abdomen limited ruq (liver/gb) · 14 of 74 slices shown]
[im 1/74]
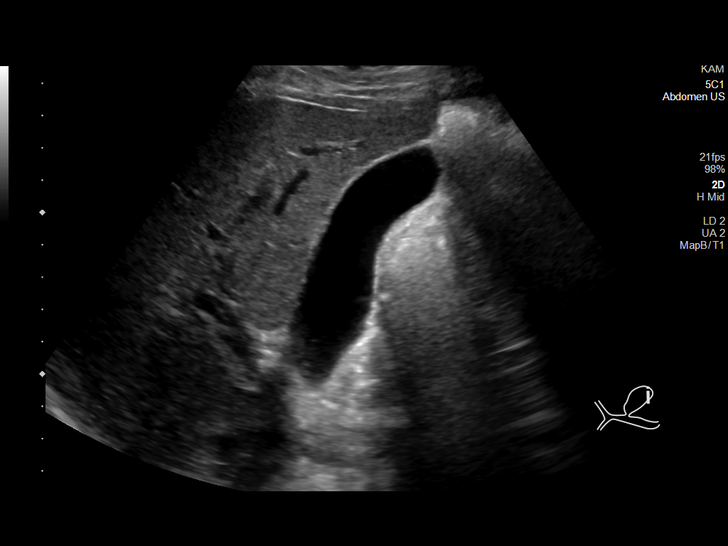
[im 7/74]
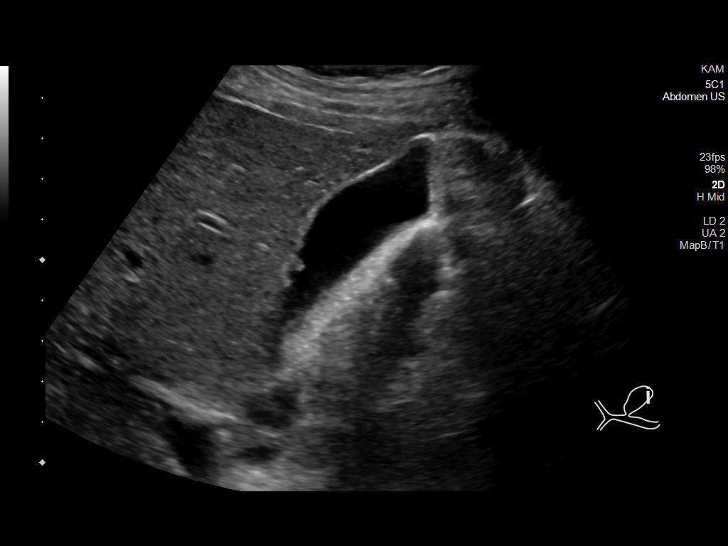
[im 13/74]
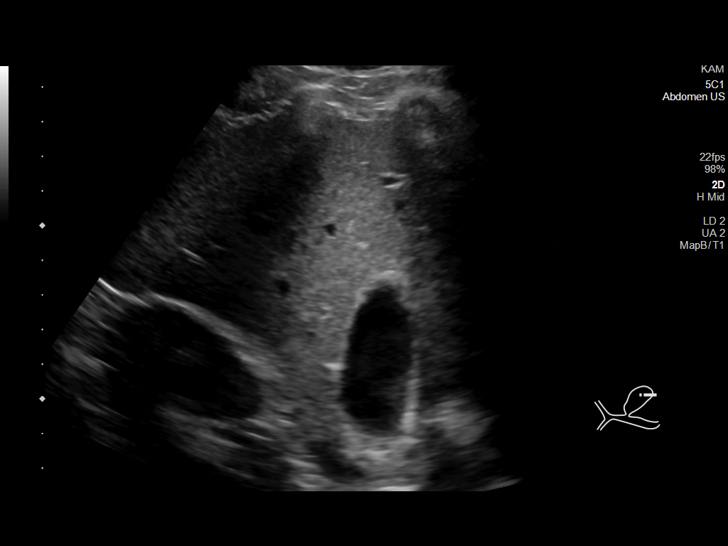
[im 19/74]
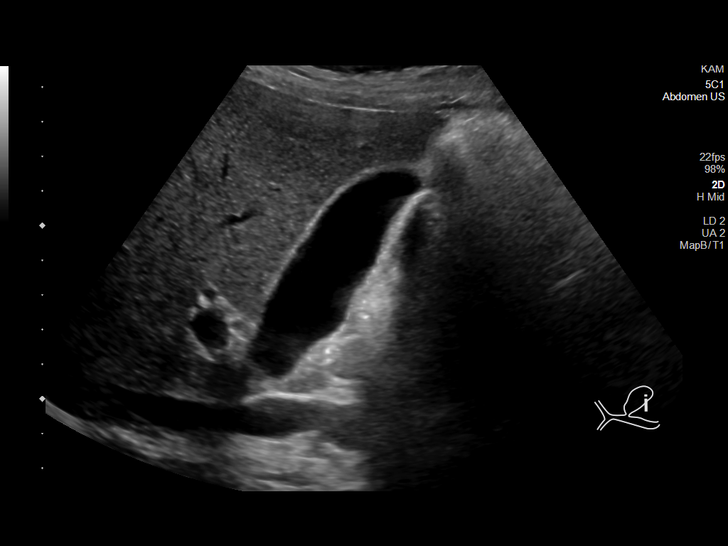
[im 25/74]
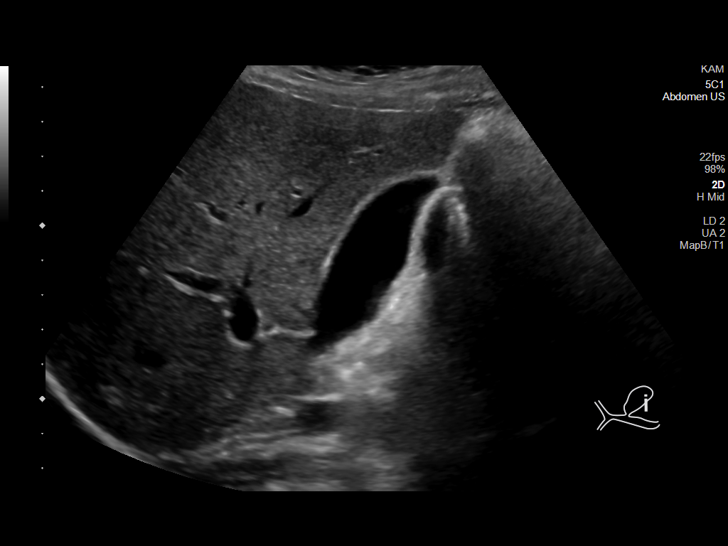
[im 28/74]
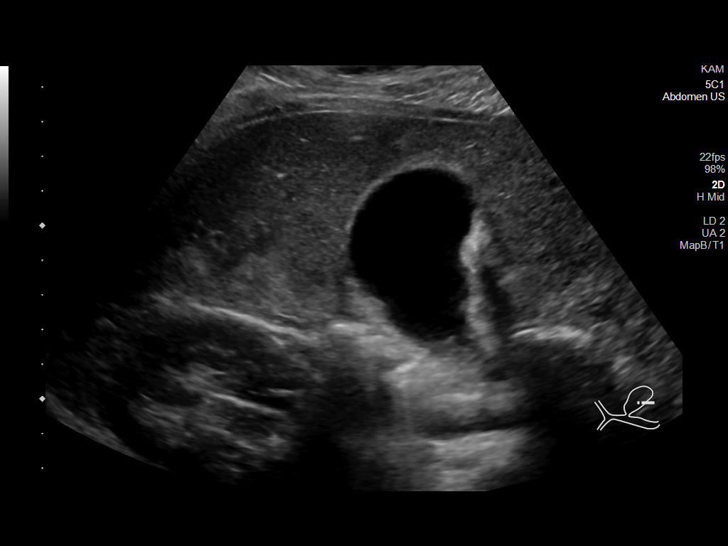
[im 34/74]
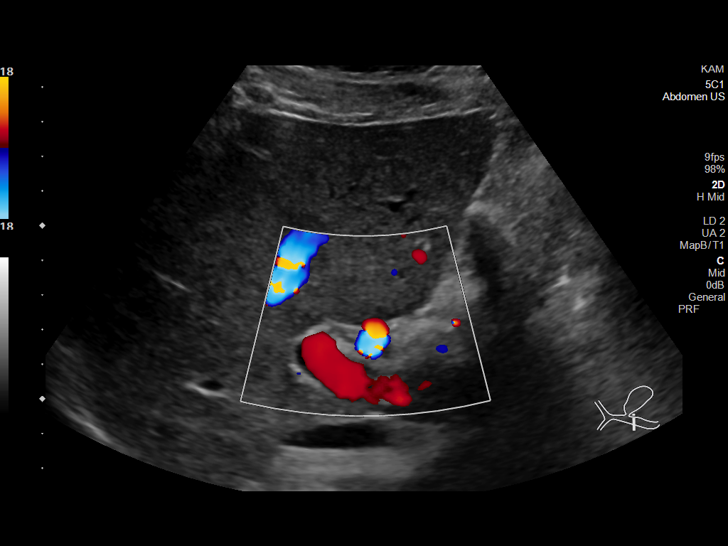
[im 40/74]
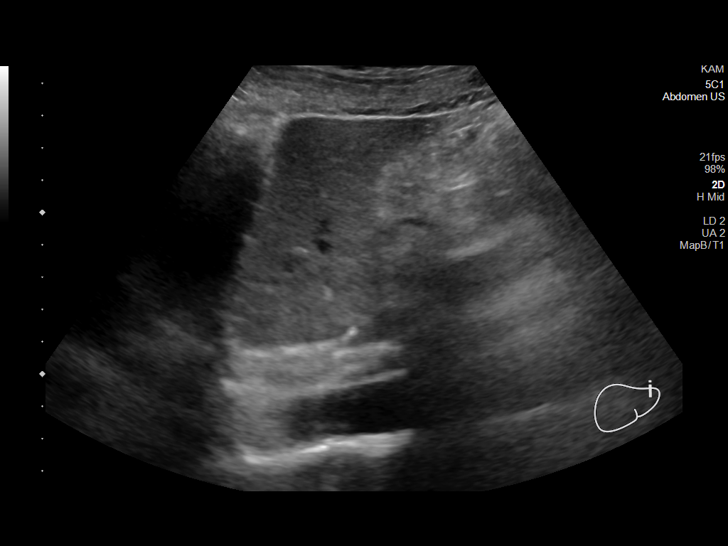
[im 46/74]
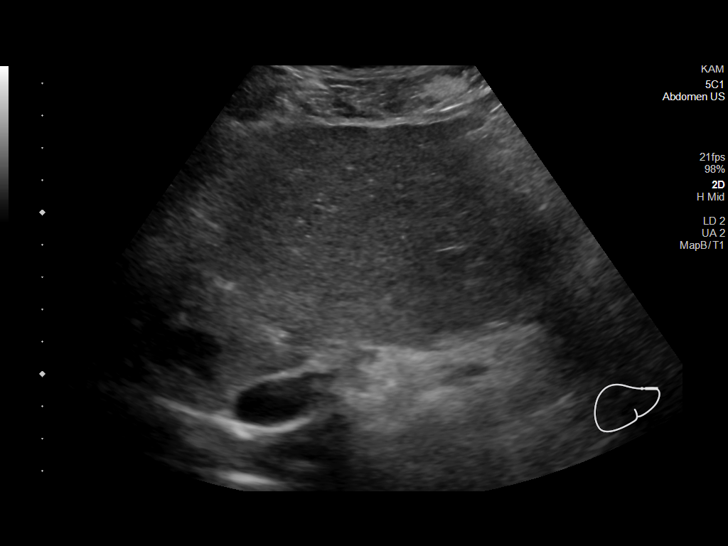
[im 49/74]
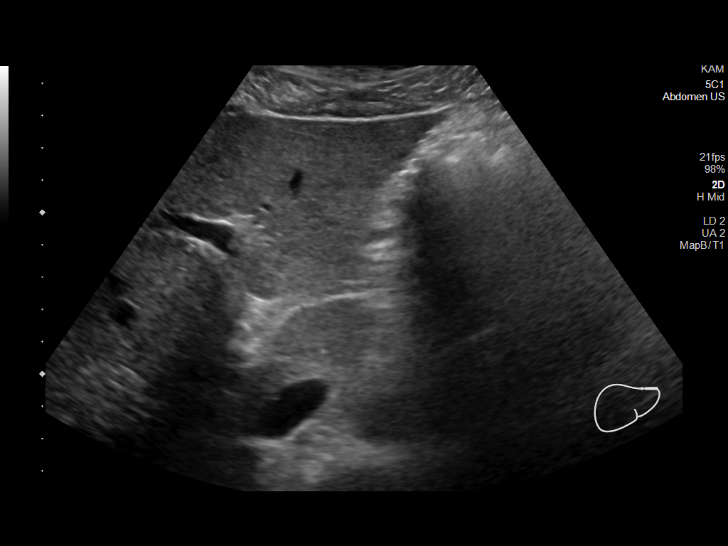
[im 55/74]
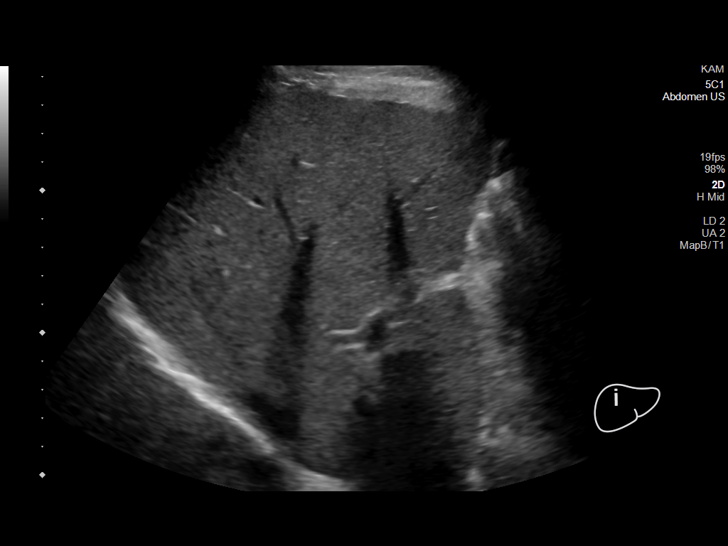
[im 61/74]
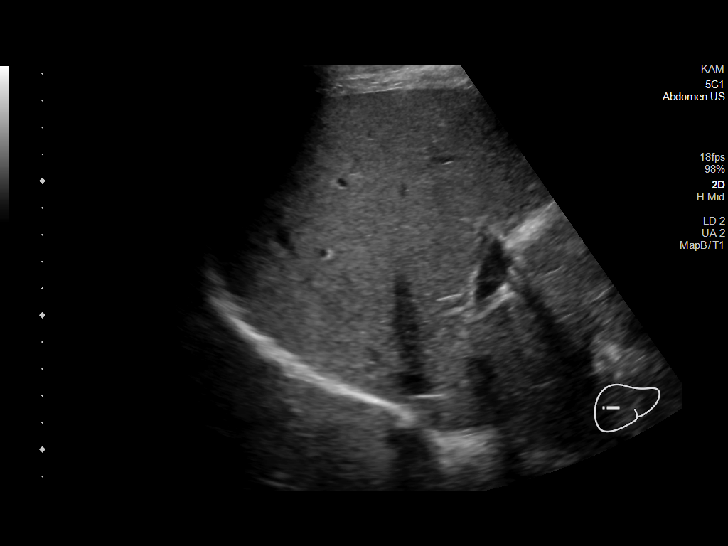
[im 67/74]
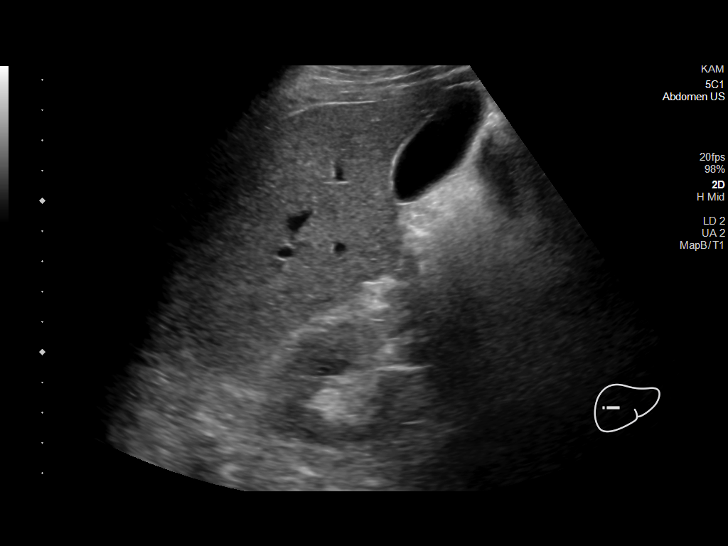
[im 74/74]
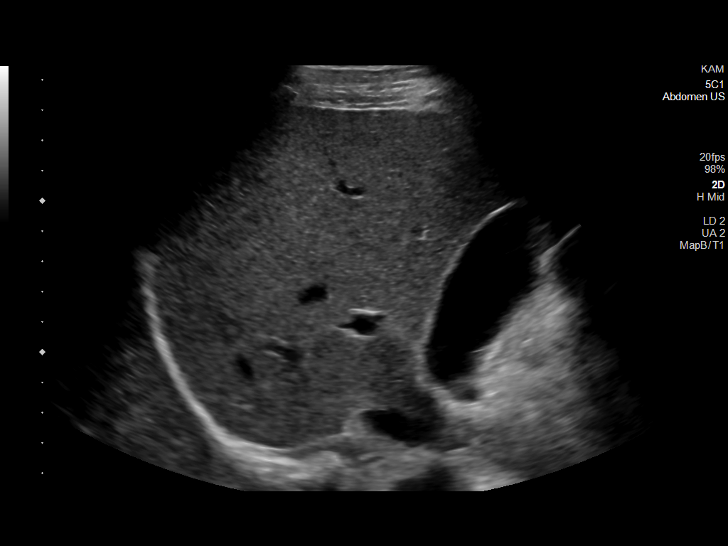

[14 of 25 positions shown; findings below may reference images not displayed]

FINDINGS: Gallbladder:

A 5 mm x 4 mm x 5 mm nonshadowing echogenic gallbladder polyp is
seen along the nondependent wall of the gallbladder lumen. No
gallstones or wall thickening visualized (2.5 mm. No sonographic
Murphy sign noted by sonographer.

Common bile duct:

Diameter: 3.1 mm

Liver:

No focal lesion identified. Within normal limits in parenchymal
echogenicity. Portal vein is patent on color Doppler imaging with
normal direction of blood flow towards the liver.

Other: None.
IMPRESSION: 5 mm gallbladder polyp, likely benign. No additional follow-up or
imaging is recommended. This recommendation follows ACR consensus
guidelines: White Paper of the ACR Incidental Findings Committee II
on Gallbladder and Biliary Findings. [HOSPITAL]
